# Patient Record
Sex: Female | Born: 1940
Health system: Southern US, Community
[De-identification: ages and names within clinical notes are randomized; demographics above are authoritative.]

## PROBLEM LIST (undated history)

## (undated) DIAGNOSIS — D649 Anemia, unspecified: Secondary | ICD-10-CM

## (undated) DIAGNOSIS — I10 Essential (primary) hypertension: Secondary | ICD-10-CM

## (undated) DIAGNOSIS — R011 Cardiac murmur, unspecified: Secondary | ICD-10-CM

## (undated) DIAGNOSIS — R609 Edema, unspecified: Secondary | ICD-10-CM

## (undated) DIAGNOSIS — N6009 Solitary cyst of unspecified breast: Secondary | ICD-10-CM

## (undated) DIAGNOSIS — M1990 Unspecified osteoarthritis, unspecified site: Secondary | ICD-10-CM

## (undated) DIAGNOSIS — C801 Malignant (primary) neoplasm, unspecified: Secondary | ICD-10-CM

## (undated) HISTORY — PX: JOINT REPLACEMENT: SHX530

## (undated) HISTORY — PX: ABDOMINAL HYSTERECTOMY: SHX81

## (undated) HISTORY — PX: EYE SURGERY: SHX253

## (undated) HISTORY — DX: Essential (primary) hypertension: I10

## (undated) HISTORY — PX: BREAST SURGERY: SHX581

## (undated) HISTORY — PX: COLONOSCOPY: SHX174

## (undated) HISTORY — PX: ANAL FISSURE REPAIR: SHX2312

## (undated) HISTORY — PX: CHOLECYSTECTOMY: SHX55

## (undated) HISTORY — PX: BREAST EXCISIONAL BIOPSY: SUR124

---

## 2001-07-13 HISTORY — PX: OTHER SURGICAL HISTORY: SHX169

## 2004-04-22 ENCOUNTER — Ambulatory Visit: Payer: Self-pay | Admitting: General Practice

## 2005-01-20 ENCOUNTER — Ambulatory Visit: Payer: Self-pay | Admitting: Gastroenterology

## 2005-05-23 ENCOUNTER — Ambulatory Visit: Payer: Self-pay | Admitting: Internal Medicine

## 2005-12-22 ENCOUNTER — Ambulatory Visit: Payer: Self-pay | Admitting: Internal Medicine

## 2006-06-06 ENCOUNTER — Ambulatory Visit: Payer: Self-pay | Admitting: Internal Medicine

## 2007-06-19 ENCOUNTER — Ambulatory Visit: Payer: Self-pay | Admitting: Internal Medicine

## 2008-07-17 ENCOUNTER — Ambulatory Visit: Payer: Self-pay | Admitting: Unknown Physician Specialty

## 2008-07-24 ENCOUNTER — Ambulatory Visit: Payer: Self-pay | Admitting: Internal Medicine

## 2009-07-26 ENCOUNTER — Ambulatory Visit: Payer: Self-pay | Admitting: Internal Medicine

## 2010-06-21 ENCOUNTER — Ambulatory Visit: Payer: Self-pay | Admitting: Unknown Physician Specialty

## 2010-06-23 ENCOUNTER — Inpatient Hospital Stay: Payer: Self-pay | Admitting: Unknown Physician Specialty

## 2010-09-06 ENCOUNTER — Ambulatory Visit: Payer: Self-pay | Admitting: Internal Medicine

## 2011-09-26 ENCOUNTER — Ambulatory Visit: Payer: Self-pay | Admitting: Internal Medicine

## 2012-09-26 ENCOUNTER — Ambulatory Visit: Payer: Self-pay | Admitting: Internal Medicine

## 2013-09-01 ENCOUNTER — Ambulatory Visit: Payer: Self-pay | Admitting: Gastroenterology

## 2013-09-30 ENCOUNTER — Ambulatory Visit: Payer: Self-pay | Admitting: Internal Medicine

## 2014-10-02 ENCOUNTER — Ambulatory Visit
Admit: 2014-10-02 | Disposition: A | Discharge status: Home / Self Care | Payer: Self-pay | Attending: Internal Medicine | Admitting: Internal Medicine

## 2014-10-09 ENCOUNTER — Ambulatory Visit
Admit: 2014-10-09 | Disposition: A | Discharge status: Home / Self Care | Payer: Self-pay | Attending: Internal Medicine | Admitting: Internal Medicine

## 2015-06-17 DIAGNOSIS — I10 Essential (primary) hypertension: Secondary | ICD-10-CM | POA: Diagnosis not present

## 2015-06-17 DIAGNOSIS — Z0000 Encounter for general adult medical examination without abnormal findings: Secondary | ICD-10-CM | POA: Diagnosis not present

## 2015-06-17 DIAGNOSIS — M5414 Radiculopathy, thoracic region: Secondary | ICD-10-CM | POA: Diagnosis not present

## 2015-06-17 DIAGNOSIS — M940 Chondrocostal junction syndrome [Tietze]: Secondary | ICD-10-CM | POA: Diagnosis not present

## 2015-07-08 DIAGNOSIS — Z0000 Encounter for general adult medical examination without abnormal findings: Secondary | ICD-10-CM | POA: Diagnosis not present

## 2015-07-08 DIAGNOSIS — Z23 Encounter for immunization: Secondary | ICD-10-CM | POA: Diagnosis not present

## 2015-07-20 ENCOUNTER — Other Ambulatory Visit: Payer: Self-pay | Admitting: Internal Medicine

## 2015-07-20 DIAGNOSIS — Z1231 Encounter for screening mammogram for malignant neoplasm of breast: Secondary | ICD-10-CM

## 2015-08-30 DIAGNOSIS — Z85828 Personal history of other malignant neoplasm of skin: Secondary | ICD-10-CM | POA: Diagnosis not present

## 2015-08-30 DIAGNOSIS — L570 Actinic keratosis: Secondary | ICD-10-CM | POA: Diagnosis not present

## 2015-08-30 DIAGNOSIS — D485 Neoplasm of uncertain behavior of skin: Secondary | ICD-10-CM | POA: Diagnosis not present

## 2015-08-30 DIAGNOSIS — Z872 Personal history of diseases of the skin and subcutaneous tissue: Secondary | ICD-10-CM | POA: Diagnosis not present

## 2015-08-30 DIAGNOSIS — Z08 Encounter for follow-up examination after completed treatment for malignant neoplasm: Secondary | ICD-10-CM | POA: Diagnosis not present

## 2015-08-30 DIAGNOSIS — Z1283 Encounter for screening for malignant neoplasm of skin: Secondary | ICD-10-CM | POA: Diagnosis not present

## 2015-08-30 DIAGNOSIS — L821 Other seborrheic keratosis: Secondary | ICD-10-CM | POA: Diagnosis not present

## 2015-10-04 ENCOUNTER — Other Ambulatory Visit: Payer: Self-pay | Admitting: Internal Medicine

## 2015-10-04 ENCOUNTER — Ambulatory Visit
Admission: RE | Admit: 2015-10-04 | Discharge: 2015-10-04 | Disposition: A | Discharge status: Home / Self Care | Payer: PPO | Source: Ambulatory Visit | Attending: Internal Medicine | Admitting: Internal Medicine

## 2015-10-04 DIAGNOSIS — Z1231 Encounter for screening mammogram for malignant neoplasm of breast: Secondary | ICD-10-CM

## 2015-10-04 HISTORY — DX: Malignant (primary) neoplasm, unspecified: C80.1

## 2015-12-07 DIAGNOSIS — Z791 Long term (current) use of non-steroidal anti-inflammatories (NSAID): Secondary | ICD-10-CM | POA: Diagnosis not present

## 2015-12-07 DIAGNOSIS — I8392 Asymptomatic varicose veins of left lower extremity: Secondary | ICD-10-CM | POA: Diagnosis not present

## 2015-12-07 DIAGNOSIS — Z85828 Personal history of other malignant neoplasm of skin: Secondary | ICD-10-CM | POA: Diagnosis not present

## 2015-12-07 DIAGNOSIS — Z79899 Other long term (current) drug therapy: Secondary | ICD-10-CM | POA: Insufficient documentation

## 2015-12-07 DIAGNOSIS — Z7982 Long term (current) use of aspirin: Secondary | ICD-10-CM | POA: Diagnosis not present

## 2015-12-07 DIAGNOSIS — I83892 Varicose veins of left lower extremities with other complications: Secondary | ICD-10-CM | POA: Diagnosis not present

## 2015-12-08 ENCOUNTER — Emergency Department
Admission: EM | Admit: 2015-12-08 | Discharge: 2015-12-08 | Disposition: A | Discharge status: Home / Self Care | Payer: PPO | Attending: Emergency Medicine | Admitting: Emergency Medicine

## 2015-12-08 DIAGNOSIS — I83892 Varicose veins of left lower extremities with other complications: Secondary | ICD-10-CM

## 2015-12-08 NOTE — ED Provider Notes (Signed)
[facility] Emergency Department Provider Note   ____________________________________________  Time seen: Approximately 3:15 AM  I have reviewed the triage vital signs and the nursing notes.   HISTORY  Chief Complaint Wound Check    HPI Brittany Lara is a 75 y.o. female who presents to the ED from home with a chief complaint of bleeding. Patient had a varicose vein bleeding from her left lower leg 10 days ago. It was treated by EMS with direct pressure with good success. Tonight patient was taking a shower and scratched the scab off with resultant bleeding. Dressing was placed at triage with bleeding controlled. Denies use of anticoagulants. Denies recent travel or trauma/injury. Denies chest pain, shortness of breath, lightheadedness, dizziness, abdominal pain, nausea, vomiting, diarrhea.Nothing makes her symptoms better or worse.   Past Medical History  Diagnosis Date  . Cancer ([place])     skin    There are no active problems to display for this patient.   Past Surgical History  Procedure Laterality Date  . Breast biopsy Bilateral yrs ago    benign    Current Outpatient Rx  Name  Route  Sig  Dispense  Refill  . aspirin EC 81 MG tablet   Oral   Take 81 mg by mouth daily.         . cetirizine (ZYRTEC) 10 MG tablet   Oral   Take 1 tablet by mouth daily as needed.         . furosemide (LASIX) 20 MG tablet   Oral   Take 20 mg by mouth daily.         Marland Kitchen KLOR-CON M10 10 MEQ tablet   Oral   Take 1 tablet by mouth daily.           Dispense as written.   Marland Kitchen lisinopril (PRINIVIL,ZESTRIL) 10 MG tablet   Oral   Take 10 mg by mouth daily.         . naproxen sodium (ANAPROX) 220 MG tablet   Oral   Take 220 mg by mouth 2 (two) times daily with a meal.           Allergies Review of patient's allergies indicates no known allergies.  No family history on file.  Social History Social History  Substance Use Topics  .  Smoking status: Not on file  . Smokeless tobacco: Not on file  . Alcohol Use: Not on file    Review of Systems  Constitutional: No fever/chills. Eyes: No visual changes. ENT: No sore throat. Cardiovascular: Denies chest pain. Respiratory: Denies shortness of breath. Gastrointestinal: No abdominal pain.  No nausea, no vomiting.  No diarrhea.  No constipation. Genitourinary: Negative for dysuria. Musculoskeletal: Positive for left lower leg bleeding. Negative for back pain. Skin: Negative for rash. Neurological: Negative for headaches, focal weakness or numbness.  10-point ROS otherwise negative.  ____________________________________________   PHYSICAL EXAM:  VITAL SIGNS: ED Triage Vitals  Enc Vitals Group     BP 12/08/15 0003 180/73 mmHg     Pulse Rate 12/08/15 0003 54     Resp 12/08/15 0003 18     Temp 12/08/15 0003 97.8 F (36.6 C)     Temp Source 12/08/15 0003 Oral     SpO2 12/08/15 0003 99 %     Weight 12/08/15 0003 225 lb (102.059 kg)     Height 12/08/15 0003 5\' 5"  (1.651 m)     Head Cir --      Peak Flow --  Pain Score --      Pain Loc --      Pain Edu? --      Excl. in [place]? --     Constitutional: Alert and oriented. Well appearing and in no acute distress. Eyes: Conjunctivae are normal. PERRL. EOMI. Head: Atraumatic. Nose: No congestion/rhinnorhea. Mouth/Throat: Mucous membranes are moist.  Oropharynx non-erythematous. Neck: No stridor.   Cardiovascular: Normal rate, regular rhythm. Grossly normal heart sounds.  Good peripheral circulation. Respiratory: Normal respiratory effort.  No retractions. Lungs CTAB. Gastrointestinal: Soft and nontender. No distention. No abdominal bruits. No CVA tenderness. Musculoskeletal:  LLE: Nonbleeding varicose vein with central scab to anterior lower tibia. Palpable distal pulses. 1+ nonpitting BLE edema. Neurologic:  Normal speech and language. No gross focal neurologic deficits are appreciated. No gait  instability. Skin:  Skin is warm, dry and intact. No rash noted.  Psychiatric: Mood and affect are normal. Speech and behavior are normal.  ____________________________________________   LABS (all labs ordered are listed, but only abnormal results are displayed)  Labs Reviewed - No data to display ____________________________________________  EKG  None ____________________________________________  RADIOLOGY  None ____________________________________________   PROCEDURES  Procedure(s) performed: None  Critical Care performed: No  ____________________________________________   INITIAL IMPRESSION / ASSESSMENT AND PLAN / ED COURSE  Pertinent labs & imaging results that were available during my care of the patient were reviewed by me and considered in my medical decision making (see chart for details).  75 year old female who presents with bleeding varicose vein. As she was getting undressed in the treatment room, she knocked the dressing off and the area began to bleed again. Nurse applied direct pressure with gauze with immediate control of bleeding. Pain is currently not bleeding. Will apply Surgicel and compressive dressing with ice and sandbag; will reassess in 20 minutes.  ----------------------------------------- 4:29 AM on 12/08/2015 -----------------------------------------  Scab got peeled off when removing compressive dressing. Slow venous ooze. 1% lidocaine with epinephrine injected and compression reapplied with Surgicel and ABD pad. Will recheck.  ----------------------------------------- 5:25 AM on 12/08/2015 -----------------------------------------  Rechecked area. There has been no bleeding. No blood on Surgicel. Will reapply Surgicel and APD pad and wrapped in Coban. I have encouraged the patient to leave compressive dressing on until later this evening when she showers and she is not to rub or pat the area. I have also recommended that she use her  walker over the next few days to avoid putting her full weight on her left leg. Will provide contact information for vascular surgeon for follow-up. Strict return precautions given. Patient and daughter verbalize understanding and agree with plan of care. ____________________________________________   FINAL CLINICAL IMPRESSION(S) / ED DIAGNOSES  Final diagnoses:  Bleeding from varicose vein, left      NEW MEDICATIONS STARTED DURING THIS VISIT:  New Prescriptions   No medications on file     Note:  This document was prepared using Dragon voice recognition software and may include unintentional dictation errors.    Paulette Blanch, MD 12/08/15 [phone]

## 2015-12-08 NOTE — ED Notes (Signed)
When Dr. Beather Arbour took off the gauze wrap, the pt had stopped bleeding. Dr. Beather Arbour applied surgicel, gauze, wrapped in gauze, coban, ice and sandbag and will re-check later to see if bleeding remained stopped.

## 2015-12-08 NOTE — ED Notes (Signed)
Pt states she is normally bradycardic.

## 2015-12-08 NOTE — ED Notes (Signed)
Dr. Beather Arbour checked pt's leg, took off bandages. Pt started bleeding. Dr. Beather Arbour placed surgicel, used lidocaine with epi, placed an abdominal pad, ice pack, sand bag, elevated extremity to try to control bleeding.

## 2015-12-08 NOTE — Discharge Instructions (Signed)
1. Keep compressive dressing in place until this evening when you shower. Do not rub or pack the area. 2. Use your walker for the next few days so you do not put your full weight on your left leg. 3. Return to the ER for recurrent or worsening symptoms, persistent vomiting, difficult breathing, feeling faint or other concerns.  Bleeding Varicose Veins Varicose veins are veins that have become enlarged and twisted. Valves in the veins help return blood from the leg to the heart. If these valves are damaged, blood flows backward and backs up into the veins in the leg near the skin. This causes the veins to become larger because of increased pressure within them. Sometimes these veins bleed. CAUSES  Factors that can lead to bleeding varicose veins include:  Thinning of the skin that covers the veins. This skin is stretched as the veins enlarge.  Weak and thinning walls of the varicose veins. These thin walls are part of the reason why blood is not flowing normally to the heart.  Having high pressure in the veins. This high pressure occurs because the blood is not flowing freely back up to the heart.  Injury. Even a small injury to a varicose vein can cause bleeding.  Open wounds. A sore may develop near a varicose vein and not heal. This makes bleeding more likely.  Taking medicine that thins the blood. These medicines may include aspirin, anti-inflammatory medicine, and other blood thinners. SIGNS AND SYMPTOMS  If bleeding is on the outside surface of the skin, blood can be seen. Sometimes, the bleeding stays under the skin. If this happens, the blue or purple area will spread beyond the vein. This discoloration may be visible. DIAGNOSIS  To decide if you have a bleeding varicose vein, your health care provider may:  Ask about your symptoms. This will include when you first saw bleeding.  Ask about how long you have had varicose veins and if they cause you problems.  Ask about your overall  health.  Ask about possible causes, such as recent cuts or if the area near the varicose veins was bumped or injured.  Examine the skin or leg that concerns you. Your health care provider will probably feel the veins.  Order imaging tests. These create detailed pictures of the veins. TREATMENT  The first goal of treating bleeding varicose veins is to stop the bleeding. Then, the aim is to keep any bleeding from happening again. Treatment will depend on the cause of the bleeding and how bad it is. Ask your health care provider about what would be best for you. Options include:  Raising (elevating) your leg. Lie down with your leg propped up on a pillow or cushion. Your foot should be above the level of your heart.  Applying pressure to the spot that is bleeding. The bleeding should stop in a short time.  Wearing elastic stockings that "compress" your legs (compression stockings). An elastic bandage may do the same thing.  Applying an antibiotic cream on sores that are not healing.  Closing off or surgically removing the bleeding varicose veins with one of the following:  Sclerotherapy. A solution is injected into the vein to close it off.  Laser treatment. A laser is used to heat the vein to close it off.  Radiofrequency vein ablation. An electrical current produced by radio waves is used to close off the vein.  Phlebectomy. The vein is surgically removed through small incisions made over the varicose vein.  Vein  ligation and stripping. The vein is surgically removed through incisions made over the varicose vein after the vein has been tied (ligated). HOME CARE INSTRUCTIONS   Apply any creams that your health care provider prescribed. Follow the directions carefully.  Wear compression stockings or any wraps as directed by your health care provider. Make sure you know:  If you should wear them every day.  How long you should wear them.  If veins were removed or closed, a bandage  (dressing) will probably cover the area. Make sure you know:  How often the dressing should be changed.  Whether the area can get wet.  When you can leave the skin uncovered.  Check your skin every day. Look for new sores and signs of bleeding.  To prevent future bleeding:  Use extra care in situations where you could cut your legs, such as when shaving or gardening.  Try to keep your legs elevated as much as possible. Lie down when you can. SEEK MEDICAL CARE IF:   Your veins continue to bleed.  You develop new sores near your varicose veins.  You have a sore that does not heal or gets bigger.  You have increased pain in your leg.  The area around a varicose vein becomes warm, red, or tender to the touch.  You notice a yellowish fluid that smells bad coming from a spot where there was bleeding.  You have a fever. SEEK IMMEDIATE MEDICAL CARE IF:   You have chest pain or difficulty breathing.  You have severe leg pain.   This information is not intended to replace advice given to you by your health care provider. Make sure you discuss any questions you have with your health care provider.   Document Released: 10/15/2008 Document Revised: 06/19/2014 Document Reviewed: 09/30/2013 Elsevier Interactive Patient Education [employer].

## 2015-12-08 NOTE — ED Notes (Signed)
Pt was taking shower and noted bleeding from left lower leg, hx of the same 10 days ago and was a varicose vein.

## 2015-12-15 DIAGNOSIS — M7989 Other specified soft tissue disorders: Secondary | ICD-10-CM | POA: Diagnosis not present

## 2015-12-15 DIAGNOSIS — I8312 Varicose veins of left lower extremity with inflammation: Secondary | ICD-10-CM | POA: Diagnosis not present

## 2015-12-15 DIAGNOSIS — I8311 Varicose veins of right lower extremity with inflammation: Secondary | ICD-10-CM | POA: Diagnosis not present

## 2015-12-24 DIAGNOSIS — I8311 Varicose veins of right lower extremity with inflammation: Secondary | ICD-10-CM | POA: Diagnosis not present

## 2015-12-24 DIAGNOSIS — I8312 Varicose veins of left lower extremity with inflammation: Secondary | ICD-10-CM | POA: Diagnosis not present

## 2015-12-24 DIAGNOSIS — M7989 Other specified soft tissue disorders: Secondary | ICD-10-CM | POA: Diagnosis not present

## 2015-12-24 DIAGNOSIS — I868 Varicose veins of other specified sites: Secondary | ICD-10-CM | POA: Diagnosis not present

## 2015-12-28 DIAGNOSIS — M7989 Other specified soft tissue disorders: Secondary | ICD-10-CM | POA: Diagnosis not present

## 2015-12-28 DIAGNOSIS — I868 Varicose veins of other specified sites: Secondary | ICD-10-CM | POA: Diagnosis not present

## 2015-12-28 DIAGNOSIS — M79609 Pain in unspecified limb: Secondary | ICD-10-CM | POA: Diagnosis not present

## 2015-12-28 DIAGNOSIS — I8311 Varicose veins of right lower extremity with inflammation: Secondary | ICD-10-CM | POA: Diagnosis not present

## 2015-12-28 DIAGNOSIS — I8312 Varicose veins of left lower extremity with inflammation: Secondary | ICD-10-CM | POA: Diagnosis not present

## 2015-12-28 DIAGNOSIS — I872 Venous insufficiency (chronic) (peripheral): Secondary | ICD-10-CM | POA: Diagnosis not present

## 2016-01-17 DIAGNOSIS — M5010 Cervical disc disorder with radiculopathy, unspecified cervical region: Secondary | ICD-10-CM | POA: Diagnosis not present

## 2016-02-01 DIAGNOSIS — I83813 Varicose veins of bilateral lower extremities with pain: Secondary | ICD-10-CM | POA: Diagnosis not present

## 2016-02-01 DIAGNOSIS — I868 Varicose veins of other specified sites: Secondary | ICD-10-CM | POA: Diagnosis not present

## 2016-02-01 DIAGNOSIS — I8312 Varicose veins of left lower extremity with inflammation: Secondary | ICD-10-CM | POA: Diagnosis not present

## 2016-02-01 DIAGNOSIS — R58 Hemorrhage, not elsewhere classified: Secondary | ICD-10-CM | POA: Diagnosis not present

## 2016-02-01 DIAGNOSIS — M7989 Other specified soft tissue disorders: Secondary | ICD-10-CM | POA: Diagnosis not present

## 2016-02-01 DIAGNOSIS — M79609 Pain in unspecified limb: Secondary | ICD-10-CM | POA: Diagnosis not present

## 2016-02-01 DIAGNOSIS — I8311 Varicose veins of right lower extremity with inflammation: Secondary | ICD-10-CM | POA: Diagnosis not present

## 2016-02-01 DIAGNOSIS — I872 Venous insufficiency (chronic) (peripheral): Secondary | ICD-10-CM | POA: Diagnosis not present

## 2016-02-04 DIAGNOSIS — M7989 Other specified soft tissue disorders: Secondary | ICD-10-CM | POA: Diagnosis not present

## 2016-02-04 DIAGNOSIS — I8312 Varicose veins of left lower extremity with inflammation: Secondary | ICD-10-CM | POA: Diagnosis not present

## 2016-02-04 DIAGNOSIS — I872 Venous insufficiency (chronic) (peripheral): Secondary | ICD-10-CM | POA: Diagnosis not present

## 2016-02-04 DIAGNOSIS — I83813 Varicose veins of bilateral lower extremities with pain: Secondary | ICD-10-CM | POA: Diagnosis not present

## 2016-02-04 DIAGNOSIS — I868 Varicose veins of other specified sites: Secondary | ICD-10-CM | POA: Diagnosis not present

## 2016-02-04 DIAGNOSIS — I8311 Varicose veins of right lower extremity with inflammation: Secondary | ICD-10-CM | POA: Diagnosis not present

## 2016-02-04 DIAGNOSIS — M79609 Pain in unspecified limb: Secondary | ICD-10-CM | POA: Diagnosis not present

## 2016-02-04 DIAGNOSIS — R58 Hemorrhage, not elsewhere classified: Secondary | ICD-10-CM | POA: Diagnosis not present

## 2016-02-22 DIAGNOSIS — M7989 Other specified soft tissue disorders: Secondary | ICD-10-CM | POA: Diagnosis not present

## 2016-02-22 DIAGNOSIS — M79609 Pain in unspecified limb: Secondary | ICD-10-CM | POA: Diagnosis not present

## 2016-02-22 DIAGNOSIS — I8312 Varicose veins of left lower extremity with inflammation: Secondary | ICD-10-CM | POA: Diagnosis not present

## 2016-02-22 DIAGNOSIS — I8311 Varicose veins of right lower extremity with inflammation: Secondary | ICD-10-CM | POA: Diagnosis not present

## 2016-02-22 DIAGNOSIS — R58 Hemorrhage, not elsewhere classified: Secondary | ICD-10-CM | POA: Diagnosis not present

## 2016-02-22 DIAGNOSIS — I83813 Varicose veins of bilateral lower extremities with pain: Secondary | ICD-10-CM | POA: Diagnosis not present

## 2016-02-22 DIAGNOSIS — I872 Venous insufficiency (chronic) (peripheral): Secondary | ICD-10-CM | POA: Diagnosis not present

## 2016-02-22 DIAGNOSIS — I868 Varicose veins of other specified sites: Secondary | ICD-10-CM | POA: Diagnosis not present

## 2016-02-28 DIAGNOSIS — Z08 Encounter for follow-up examination after completed treatment for malignant neoplasm: Secondary | ICD-10-CM | POA: Diagnosis not present

## 2016-02-28 DIAGNOSIS — Z872 Personal history of diseases of the skin and subcutaneous tissue: Secondary | ICD-10-CM | POA: Diagnosis not present

## 2016-02-28 DIAGNOSIS — Z1283 Encounter for screening for malignant neoplasm of skin: Secondary | ICD-10-CM | POA: Diagnosis not present

## 2016-02-28 DIAGNOSIS — Z85828 Personal history of other malignant neoplasm of skin: Secondary | ICD-10-CM | POA: Diagnosis not present

## 2016-02-28 DIAGNOSIS — D485 Neoplasm of uncertain behavior of skin: Secondary | ICD-10-CM | POA: Diagnosis not present

## 2016-02-28 DIAGNOSIS — L821 Other seborrheic keratosis: Secondary | ICD-10-CM | POA: Diagnosis not present

## 2016-03-06 DIAGNOSIS — I83813 Varicose veins of bilateral lower extremities with pain: Secondary | ICD-10-CM | POA: Diagnosis not present

## 2016-03-06 DIAGNOSIS — I868 Varicose veins of other specified sites: Secondary | ICD-10-CM | POA: Diagnosis not present

## 2016-03-06 DIAGNOSIS — I8312 Varicose veins of left lower extremity with inflammation: Secondary | ICD-10-CM | POA: Diagnosis not present

## 2016-03-06 DIAGNOSIS — I872 Venous insufficiency (chronic) (peripheral): Secondary | ICD-10-CM | POA: Diagnosis not present

## 2016-03-06 DIAGNOSIS — M7989 Other specified soft tissue disorders: Secondary | ICD-10-CM | POA: Diagnosis not present

## 2016-03-06 DIAGNOSIS — R58 Hemorrhage, not elsewhere classified: Secondary | ICD-10-CM | POA: Diagnosis not present

## 2016-03-06 DIAGNOSIS — M79609 Pain in unspecified limb: Secondary | ICD-10-CM | POA: Diagnosis not present

## 2016-03-06 DIAGNOSIS — I8311 Varicose veins of right lower extremity with inflammation: Secondary | ICD-10-CM | POA: Diagnosis not present

## 2016-03-27 ENCOUNTER — Encounter (INDEPENDENT_AMBULATORY_CARE_PROVIDER_SITE_OTHER): Payer: Self-pay | Admitting: Vascular Surgery

## 2016-03-27 ENCOUNTER — Ambulatory Visit (INDEPENDENT_AMBULATORY_CARE_PROVIDER_SITE_OTHER): Payer: PPO | Admitting: Vascular Surgery

## 2016-03-27 VITALS — BP 138/83 | HR 79 | Resp 16 | Ht 65.0 in | Wt 230.0 lb

## 2016-03-27 DIAGNOSIS — I8312 Varicose veins of left lower extremity with inflammation: Secondary | ICD-10-CM | POA: Diagnosis not present

## 2016-03-27 DIAGNOSIS — I8311 Varicose veins of right lower extremity with inflammation: Secondary | ICD-10-CM

## 2016-03-27 NOTE — Progress Notes (Signed)
Left Leg Sclerotherapy  Indication: Patient presents with symptomatic varicose veins with hemorrhage of the left lower extremity.  Procedure: Sclerotherapy using hypertonic saline mixed with 1% Lidocaine was performed on the left lower extremity. Compression wraps were placed. The patient tolerated the procedure well.  Plan: Follow up as needed.

## 2016-04-21 DIAGNOSIS — M542 Cervicalgia: Secondary | ICD-10-CM | POA: Diagnosis not present

## 2016-07-04 DIAGNOSIS — I10 Essential (primary) hypertension: Secondary | ICD-10-CM | POA: Diagnosis not present

## 2016-07-04 DIAGNOSIS — Z0000 Encounter for general adult medical examination without abnormal findings: Secondary | ICD-10-CM | POA: Diagnosis not present

## 2016-07-11 DIAGNOSIS — Z0000 Encounter for general adult medical examination without abnormal findings: Secondary | ICD-10-CM | POA: Diagnosis not present

## 2016-07-11 DIAGNOSIS — M47812 Spondylosis without myelopathy or radiculopathy, cervical region: Secondary | ICD-10-CM | POA: Diagnosis not present

## 2016-07-11 DIAGNOSIS — M542 Cervicalgia: Secondary | ICD-10-CM | POA: Diagnosis not present

## 2016-07-17 ENCOUNTER — Telehealth (INDEPENDENT_AMBULATORY_CARE_PROVIDER_SITE_OTHER): Payer: Self-pay | Admitting: Vascular Surgery

## 2016-07-17 NOTE — Telephone Encounter (Signed)
Patient called and said that she still has a scab from her sclerotherapy injections. She stated that it sometimes bleeds. Her last treatment was October. She wants to know if and when it will clear up or does she need an appointment. [phone]

## 2016-08-01 ENCOUNTER — Encounter (INDEPENDENT_AMBULATORY_CARE_PROVIDER_SITE_OTHER): Payer: Self-pay | Admitting: Vascular Surgery

## 2016-08-01 ENCOUNTER — Ambulatory Visit (INDEPENDENT_AMBULATORY_CARE_PROVIDER_SITE_OTHER): Payer: PPO | Admitting: Vascular Surgery

## 2016-08-01 VITALS — BP 155/84 | HR 84 | Resp 16 | Ht 65.0 in | Wt 240.0 lb

## 2016-08-01 DIAGNOSIS — I83812 Varicose veins of left lower extremities with pain: Secondary | ICD-10-CM

## 2016-08-01 DIAGNOSIS — I10 Essential (primary) hypertension: Secondary | ICD-10-CM | POA: Diagnosis not present

## 2016-08-01 NOTE — Assessment & Plan Note (Signed)
She has developed a scab on her left calf. She has had significant sclerotherapy and it is not entirely clear if this is a chronic ulceration secondary to sclerotherapy site or just a venous insufficiency issue. She does not have fever or chills. I unroofed the scab today to the underlying granulation tissue which is pretty good. I would recommend antibacterial ointment. I would cover this area with a Band-Aid or gauze. I will see her back in about 3 weeks for a wound check.

## 2016-08-01 NOTE — Assessment & Plan Note (Signed)
blood pressure control important in reducing the progression of atherosclerotic disease. On appropriate oral medications.  

## 2016-08-01 NOTE — Progress Notes (Signed)
MRN : [id number]  Brittany Lara is a 76 y.o. (Apr 03, 1941) female who presents with chief complaint of  Chief Complaint  Patient presents with  . Re-evaluation    2 week follow up, check out surgery site  .  History of Present Illness: Patient returns today in follow up of Venous insufficiency. She had sclerotherapy performed several months ago. She has had a nonhealing scab on the left anterior shin area since that time. She returns today with mild drainage from the scab. It is not overtly painful. There is no erythema.  Current Outpatient Prescriptions  Medication Sig Dispense Refill  . aspirin EC 81 MG tablet Take 81 mg by mouth daily.    . cetirizine (ZYRTEC) 10 MG tablet Take 1 tablet by mouth daily as needed.    . Cholecalciferol (VITAMIN D3) 1000 units CAPS Take by mouth.    . furosemide (LASIX) 20 MG tablet Take 20 mg by mouth daily.    Marland Kitchen KLOR-CON M10 10 MEQ tablet Take 1 tablet by mouth daily.    Marland Kitchen lisinopril (PRINIVIL,ZESTRIL) 10 MG tablet Take 10 mg by mouth daily.    . meloxicam (MOBIC) 7.5 MG tablet Take by mouth.    . naproxen sodium (ANAPROX) 220 MG tablet Take 220 mg by mouth 2 (two) times daily with a meal.    . PREVNAR 13 SUSP injection      No current facility-administered medications for this visit.     Past Medical History:  Diagnosis Date  . Cancer [facility])    skin    Past Surgical History:  Procedure Laterality Date  . BREAST BIOPSY Bilateral yrs ago   benign    Social History Social History  Substance Use Topics  . Smoking status: Never Smoker  . Smokeless tobacco: Never Used  . Alcohol use No     Family History No bleeding or clotting disorders  No Known Allergies   REVIEW OF SYSTEMS (Negative unless checked)  Constitutional: [] Weight loss  [] Fever  [] Chills Cardiac: [] Chest pain   [] Chest pressure   [] Palpitations   [] Shortness of breath when laying flat   [] Shortness of breath at rest   [] Shortness of breath with  exertion. Vascular:  [] Pain in legs with walking   [] Pain in legs at rest   [] Pain in legs when laying flat   [] Claudication   [] Pain in feet when walking  [] Pain in feet at rest  [] Pain in feet when laying flat   [] History of DVT   [] Phlebitis   [x] Swelling in legs   [x] Varicose veins   [] Non-healing ulcers Pulmonary:   [] Uses home oxygen   [] Productive cough   [] Hemoptysis   [] Wheeze  [] COPD   [] Asthma Neurologic:  [] Dizziness  [] Blackouts   [] Seizures   [] History of stroke   [] History of TIA  [] Aphasia   [] Temporary blindness   [] Dysphagia   [] Weakness or numbness in arms   [] Weakness or numbness in legs Musculoskeletal:  [] Arthritis   [] Joint swelling   [] Joint pain   [] Low back pain Hematologic:  [] Easy bruising  [] Easy bleeding   [] Hypercoagulable state   [] Anemic   Gastrointestinal:  [] Blood in stool   [] Vomiting blood  [] Gastroesophageal reflux/heartburn   [] Abdominal pain Genitourinary:  [] Chronic kidney disease   [] Difficult urination  [] Frequent urination  [] Burning with urination   [] Hematuria Skin:  [] Rashes   [x] Ulcers   [x] Wounds Psychological:  [] History of anxiety   []  History of major depression.  Physical Examination  BP Marland Kitchen)  155/84 (BP Location: Right Arm)   Pulse 84   Resp 16   Ht 5\' 5"  (1.651 m)   Wt 240 lb (108.9 kg)   BMI 39.94 kg/m  Gen:  WD/WN, NAD Head: [place]/AT, No temporalis wasting. Ear/Nose/Throat: Hearing grossly intact, nares w/o erythema or drainage, trachea midline Eyes: Conjunctiva clear. Sclera non-icteric Neck: Supple.  No JVD.  Pulmonary:  Good air movement, no use of accessory muscles.  Cardiac: RRR, normal S1, S2 Vascular:  Vessel Right Left  Radial Palpable Palpable  Ulnar Palpable Palpable  Brachial Palpable Palpable  Carotid Palpable, without bruit Palpable, without bruit  Aorta Not palpable N/A  Femoral Palpable Palpable  Popliteal Palpable Palpable  PT Palpable Palpable  DP Palpable Palpable   Gastrointestinal: soft,  non-tender/non-distended. No guarding/reflex.  Musculoskeletal: M/S 5/5 throughout.  No deformity or atrophy. Diffuse varicosities bilaterally. Neurologic: Sensation grossly intact in extremities.  Symmetrical.  Speech is fluent.  Psychiatric: Judgment intact, Mood & affect appropriate for pt's clinical situation. Dermatologic: About a dime-sized scab in the mid anterior left shin/calf area. This was unroofed today to find some fibrinous exudate and good granulation tissue. Lymph : No Cervical, Axillary, or Inguinal lymphadenopathy.      Labs No results found for this or any previous visit (from the past 2160 hour(s)).  Radiology No results found.    Assessment/Plan  Essential hypertension, benign blood pressure control important in reducing the progression of atherosclerotic disease. On appropriate oral medications.   Varicose veins of leg with pain, left She has developed a scab on her left calf. She has had significant sclerotherapy and it is not entirely clear if this is a chronic ulceration secondary to sclerotherapy site or just a venous insufficiency issue. She does not have fever or chills. I unroofed the scab today to the underlying granulation tissue which is pretty good. I would recommend antibacterial ointment. I would cover this area with a Band-Aid or gauze. I will see her back in about 3 weeks for a wound check.    Leotis Pain, MD  08/01/2016 2:26 PM    This note was created with Dragon medical transcription system.  Any errors from dictation are purely unintentional

## 2016-08-22 ENCOUNTER — Encounter (INDEPENDENT_AMBULATORY_CARE_PROVIDER_SITE_OTHER): Payer: Self-pay | Admitting: Vascular Surgery

## 2016-08-22 ENCOUNTER — Ambulatory Visit (INDEPENDENT_AMBULATORY_CARE_PROVIDER_SITE_OTHER): Payer: PPO | Admitting: Vascular Surgery

## 2016-08-22 VITALS — BP 166/85 | HR 92 | Resp 16 | Ht 65.0 in | Wt 235.0 lb

## 2016-08-22 DIAGNOSIS — I83812 Varicose veins of left lower extremities with pain: Secondary | ICD-10-CM

## 2016-08-22 DIAGNOSIS — I10 Essential (primary) hypertension: Secondary | ICD-10-CM

## 2016-08-22 NOTE — Assessment & Plan Note (Addendum)
I unroofed this scab again today and use some silver nitrate on the granulation tissue to try to get this area to close up. If this does not close up in the next few weeks, I think we should consider some foam sclerotherapy for the surrounding varicosities as the increased venous pressure is likely contributing to poor healing. I will see her back in 3 weeks.

## 2016-08-22 NOTE — Assessment & Plan Note (Signed)
blood pressure control important in reducing the progression of atherosclerotic disease. On appropriate oral medications.  

## 2016-08-22 NOTE — Progress Notes (Signed)
MRN : [id number]  Brittany Lara is a 76 y.o. (08-Mar-1941) female who presents with chief complaint of  Chief Complaint  Patient presents with  . Re-evaluation    Left leg wound check  .  History of Present Illness: Patient returns today in follow up of her left anterior calf wound. This area has recurred and created some more heaped up granulation tissue with a scab. There is no erythema. There are prominent varicosity surrounding this.   Current Outpatient Prescriptions  Medication Sig Dispense Refill  . aspirin EC 81 MG tablet Take 81 mg by mouth daily.    . cetirizine (ZYRTEC) 10 MG tablet Take 1 tablet by mouth daily as needed.    . Cholecalciferol (VITAMIN D3) 1000 units CAPS Take by mouth.    . furosemide (LASIX) 20 MG tablet Take 20 mg by mouth daily.    Marland Kitchen KLOR-CON M10 10 MEQ tablet Take 1 tablet by mouth daily.    Marland Kitchen lisinopril (PRINIVIL,ZESTRIL) 10 MG tablet Take 10 mg by mouth daily.    . meloxicam (MOBIC) 7.5 MG tablet Take by mouth.    . naproxen sodium (ANAPROX) 220 MG tablet Take 220 mg by mouth 2 (two) times daily with a meal.    . PREVNAR 13 SUSP injection      No current facility-administered medications for this visit.     Past Medical History:  Diagnosis Date  . Cancer [facility])    skin         Past Surgical History:  Procedure Laterality Date  . BREAST BIOPSY Bilateral yrs ago   benign    Social History     Social History  Substance Use Topics  . Smoking status: Never Smoker  . Smokeless tobacco: Never Used  . Alcohol use No     Family History No bleeding or clotting disorders  No Known Allergies   REVIEW OF SYSTEMS (Negative unless checked)  Constitutional: [] Weight loss  [] Fever  [] Chills Cardiac: [] Chest pain   [] Chest pressure   [] Palpitations   [] Shortness of breath when laying flat   [] Shortness of breath at rest   [] Shortness of breath with exertion. Vascular:  [] Pain in legs with walking    [] Pain in legs at rest   [] Pain in legs when laying flat   [] Claudication   [] Pain in feet when walking  [] Pain in feet at rest  [] Pain in feet when laying flat   [] History of DVT   [] Phlebitis   [x] Swelling in legs   [x] Varicose veins   [] Non-healing ulcers Pulmonary:   [] Uses home oxygen   [] Productive cough   [] Hemoptysis   [] Wheeze  [] COPD   [] Asthma Neurologic:  [] Dizziness  [] Blackouts   [] Seizures   [] History of stroke   [] History of TIA  [] Aphasia   [] Temporary blindness   [] Dysphagia   [] Weakness or numbness in arms   [] Weakness or numbness in legs Musculoskeletal:  [] Arthritis   [] Joint swelling   [] Joint pain   [] Low back pain Hematologic:  [] Easy bruising  [] Easy bleeding   [] Hypercoagulable state   [] Anemic   Gastrointestinal:  [] Blood in stool   [] Vomiting blood  [] Gastroesophageal reflux/heartburn   [] Abdominal pain Genitourinary:  [] Chronic kidney disease   [] Difficult urination  [] Frequent urination  [] Burning with urination   [] Hematuria Skin:  [] Rashes   [x] Ulcers   [x] Wounds Psychological:  [] History of anxiety   []  History of major depression.  Physical Examination  BP (!) 155/84 (BP Location: Right Arm)  Pulse 84   Resp 16   Ht 5\' 5"  (1.651 m)   Wt 240 lb (108.9 kg)   BMI 39.94 kg/m  Gen:  WD/WN, NAD Head: [place]/AT, No temporalis wasting. Ear/Nose/Throat: Hearing grossly intact, nares w/o erythema or drainage, trachea midline Eyes: Conjunctiva clear. Sclera non-icteric Neck: Supple.  No JVD.  Pulmonary:  Good air movement, no use of accessory muscles.  Cardiac: RRR, normal S1, S2 Vascular:  Vessel Right Left  Radial Palpable Palpable  Ulnar Palpable Palpable  Brachial Palpable Palpable  Carotid Palpable, without bruit Palpable, without bruit  Aorta Not palpable N/A  Femoral Palpable Palpable  Popliteal Palpable Palpable  PT Palpable Palpable  DP Palpable Palpable   Gastrointestinal: soft, non-tender/non-distended. No guarding/reflex.  Musculoskeletal: M/S  5/5 throughout.  No deformity or atrophy. Diffuse varicosities bilaterally. Neurologic: Sensation grossly intact in extremities.  Symmetrical.  Speech is fluent.  Psychiatric: Judgment intact, Mood & affect appropriate for pt's clinical situation. Dermatologic: About a dime-sized scab in the mid anterior left shin/calf area. This was unroofed today to find some fibrinous exudate and good granulation tissue. I took some silver nitrate to the granulation tissue to try to get this area to close up. Lymph : No Cervical, Axillary, or Inguinal lymphadenopathy.      Labs No results found for this or any previous visit (from the past 2160 hour(s)).  Radiology No results found.   Assessment/Plan  Varicose veins of leg with pain, left I unroofed this scab again today and use some silver nitrate on the granulation tissue to try to get this area to close up. If this does not close up in the next few weeks, I think we should consider some foam sclerotherapy for the surrounding varicosities as the increased venous pressure is likely contributing to poor healing. I will see her back in 3 weeks.   Essential hypertension, benign blood pressure control important in reducing the progression of atherosclerotic disease. On appropriate oral medications.     Leotis Pain, MD  08/22/2016 1:47 PM    This note was created with Dragon medical transcription system.  Any errors from dictation are purely unintentional

## 2016-09-12 ENCOUNTER — Ambulatory Visit (INDEPENDENT_AMBULATORY_CARE_PROVIDER_SITE_OTHER): Payer: PPO | Admitting: Vascular Surgery

## 2016-10-03 ENCOUNTER — Encounter (INDEPENDENT_AMBULATORY_CARE_PROVIDER_SITE_OTHER): Payer: Self-pay | Admitting: Vascular Surgery

## 2016-10-03 ENCOUNTER — Ambulatory Visit (INDEPENDENT_AMBULATORY_CARE_PROVIDER_SITE_OTHER): Payer: PPO | Admitting: Vascular Surgery

## 2016-10-03 VITALS — BP 143/67 | HR 66 | Resp 16 | Ht 66.0 in | Wt 234.0 lb

## 2016-10-03 DIAGNOSIS — I83812 Varicose veins of left lower extremities with pain: Secondary | ICD-10-CM

## 2016-10-03 DIAGNOSIS — I10 Essential (primary) hypertension: Secondary | ICD-10-CM | POA: Diagnosis not present

## 2016-10-03 NOTE — Progress Notes (Signed)
MRN : [id number]  Brittany Lara is a 76 y.o. (12-07-1940) female who presents with chief complaint of  Chief Complaint  Patient presents with  . Re-evaluation    3 week no studies  .  History of Present Illness: Patient returns today in follow up of Venous insufficiency and the wound on her left leg. We've previously treated this with silver nitrate, and it looks a lot better today. It is half the size were less than it was previously. There is no heaped up granulation tissue with just a nice smooth scab present currently. She says this does bleed some with showering, but there has not been a lot of bleeding or drainage from the site. It is really not painful. She has no fever or chills or signs of infection.  Current Outpatient Prescriptions  Medication Sig Dispense Refill  . aspirin EC 81 MG tablet Take 81 mg by mouth daily.    . cetirizine (ZYRTEC) 10 MG tablet Take 1 tablet by mouth daily as needed.    . Cholecalciferol (VITAMIN D3) 1000 units CAPS Take by mouth.    . furosemide (LASIX) 20 MG tablet Take 20 mg by mouth daily.    Marland Kitchen KLOR-CON M10 10 MEQ tablet Take 1 tablet by mouth daily.    Marland Kitchen lisinopril (PRINIVIL,ZESTRIL) 10 MG tablet Take 10 mg by mouth daily.    . meloxicam (MOBIC) 7.5 MG tablet Take by mouth.    . naproxen sodium (ANAPROX) 220 MG tablet Take 220 mg by mouth 2 (two) times daily with a meal.    . PREVNAR 13 SUSP injection      No current facility-administered medications for this visit.         Past Medical History:  Diagnosis Date  . Cancer [facility])    skin         Past Surgical History:  Procedure Laterality Date  . BREAST BIOPSY Bilateral yrs ago   benign    Social History     Social History  Substance Use Topics  . Smoking status: Never Smoker  . Smokeless tobacco: Never Used  . Alcohol use No     Family History No bleeding or clotting disorders  No Known Allergies   REVIEW OF  SYSTEMS(Negative unless checked)  Constitutional: [] Weight loss[] Fever[] Chills Cardiac:[] Chest pain[] Chest pressure[] Palpitations [] Shortness of breath when laying flat [] Shortness of breath at rest [] Shortness of breath with exertion. Vascular: [] Pain in legs with walking[] Pain in legsat rest[] Pain in legs when laying flat [] Claudication [] Pain in feet when walking [] Pain in feet at rest [] Pain in feet when laying flat [] History of DVT [] Phlebitis [x] Swelling in legs [x] Varicose veins [] Non-healing ulcers Pulmonary: [] Uses home oxygen [] Productive cough[] Hemoptysis [] Wheeze [] COPD [] Asthma Neurologic: [] Dizziness [] Blackouts [] Seizures [] History of stroke [] History of TIA[] Aphasia [] Temporary blindness[] Dysphagia [] Weaknessor numbness in arms [] Weakness or numbnessin legs Musculoskeletal: [] Arthritis [] Joint swelling [] Joint pain [] Low back pain Hematologic:[] Easy bruising[] Easy bleeding [] Hypercoagulable state [] Anemic  Gastrointestinal:[] Blood in stool[] Vomiting blood[] Gastroesophageal reflux/heartburn[] Abdominal pain Genitourinary: [] Chronic kidney disease [] Difficulturination [] Frequenturination [] Burning with urination[] Hematuria Skin: [] Rashes [x] Ulcers [x] Wounds Psychological: [] History of anxiety[] History of major depression.   Physical Examination  BP (!) 143/67   Pulse 66   Resp 16   Ht 5\' 6"  (1.676 m)   Wt 234 lb (106.1 kg)   BMI 37.77 kg/m  Gen:  WD/WN, NAD Head: [place]/AT, No temporalis wasting. Ear/Nose/Throat: Hearing grossly intact, nares w/o erythema or drainage, trachea midline Eyes: Conjunctiva clear. Sclera non-icteric Neck: Supple.  No JVD.  Pulmonary:  Good air movement, no  use of accessory muscles.  Cardiac: RRR, normal S1, S2 Vascular:  Vessel Right Left  Radial Palpable Palpable  Ulnar Palpable Palpable  Brachial Palpable Palpable  Carotid  Palpable, without bruit Palpable, without bruit  Aorta Not palpable N/A  Femoral Palpable Palpable  Popliteal Palpable Palpable  PT Palpable Palpable  DP Palpable Palpable   Gastrointestinal: soft, non-tender/non-distended. No guarding/reflex.  Musculoskeletal: M/S 5/5 throughout.  No deformity or atrophy. Diffuse varicosities with stasis changes present bilaterally. 1+ lower extremity edema bilaterally. The area on the left shin/calf area is now only a small scab about half the size of a dime. Neurologic: Sensation grossly intact in extremities.  Symmetrical.  Speech is fluent.  Psychiatric: Judgment intact, Mood & affect appropriate for pt's clinical situation. Dermatologic: Scab about half the size of a dime on the left shin/calf area that has been previously treated with silver nitrate. No heaped up granulation tissue today.       Labs No results found for this or any previous visit (from the past 2160 hour(s)).  Radiology No results found.    Assessment/Plan  Essential hypertension, benign blood pressure control important in reducing the progression of atherosclerotic disease. On appropriate oral medications.  Varicose veins of leg with pain, left The area is much improved although not entirely healed. I'm a bit leery of performing any more sclerotherapy at this point given the fact that this was likely one of the underlying issues that created the scab to begin with. I have discussed with her was just leave this alone and see how he does for a few months. We may eventually have to do something further to the area to reduce the venous pressure get this to heal completely, but at current is improved without erythema or infection. She will return to clinic in 3 months for a wound check.    Leotis Pain, MD  10/03/2016 4:20 PM    This note was created with Dragon medical transcription system.  Any errors from dictation are purely unintentional

## 2016-10-03 NOTE — Assessment & Plan Note (Signed)
The area is much improved although not entirely healed. I'm a bit leery of performing any more sclerotherapy at this point given the fact that this was likely one of the underlying issues that created the scab to begin with. I have discussed with her was just leave this alone and see how he does for a few months. We may eventually have to do something further to the area to reduce the venous pressure get this to heal completely, but at current is improved without erythema or infection. She will return to clinic in 3 months for a wound check.

## 2016-11-10 ENCOUNTER — Other Ambulatory Visit: Payer: Self-pay | Admitting: Internal Medicine

## 2016-11-10 DIAGNOSIS — Z1231 Encounter for screening mammogram for malignant neoplasm of breast: Secondary | ICD-10-CM

## 2016-11-29 ENCOUNTER — Ambulatory Visit
Admission: RE | Admit: 2016-11-29 | Discharge: 2016-11-29 | Disposition: A | Discharge status: Home / Self Care | Payer: PPO | Source: Ambulatory Visit | Attending: Internal Medicine | Admitting: Internal Medicine

## 2016-11-29 DIAGNOSIS — Z1231 Encounter for screening mammogram for malignant neoplasm of breast: Secondary | ICD-10-CM | POA: Insufficient documentation

## 2017-01-02 ENCOUNTER — Encounter (INDEPENDENT_AMBULATORY_CARE_PROVIDER_SITE_OTHER): Payer: Self-pay | Admitting: Vascular Surgery

## 2017-01-02 ENCOUNTER — Ambulatory Visit (INDEPENDENT_AMBULATORY_CARE_PROVIDER_SITE_OTHER): Payer: PPO | Admitting: Vascular Surgery

## 2017-01-02 VITALS — BP 148/76 | HR 54 | Resp 14 | Ht 65.0 in | Wt 235.0 lb

## 2017-01-02 DIAGNOSIS — I83812 Varicose veins of left lower extremities with pain: Secondary | ICD-10-CM | POA: Diagnosis not present

## 2017-01-02 DIAGNOSIS — I10 Essential (primary) hypertension: Secondary | ICD-10-CM

## 2017-01-02 NOTE — Progress Notes (Signed)
MRN : [id number]  Brittany Lara is a 76 y.o. (1940-12-10) female who presents with chief complaint of  Chief Complaint  Patient presents with  . Follow-up  .  History of Present Illness: Patient returns today in follow up of Venous insufficiency and a small scab on her left anterior shin which has been present for several months. This remains present. It is not painful. She is on a significant bleeding or bruising. She has avoided getting it wet or doing any mildly traumatic things to the area. She denies fever or chills. She has chronic swelling in her lower extremities which is stable.  Current Outpatient Prescriptions  Medication Sig Dispense Refill  . aspirin EC 81 MG tablet Take 81 mg by mouth daily.    . Cholecalciferol (VITAMIN D3) 1000 units CAPS Take by mouth.    . furosemide (LASIX) 20 MG tablet Take 20 mg by mouth daily.    Marland Kitchen KLOR-CON M10 10 MEQ tablet Take 1 tablet by mouth daily.    Marland Kitchen lisinopril (PRINIVIL,ZESTRIL) 10 MG tablet Take 10 mg by mouth daily.    . naproxen sodium (ANAPROX) 220 MG tablet Take 220 mg by mouth 2 (two) times daily with a meal.    . PREVNAR 13 SUSP injection     . cetirizine (ZYRTEC) 10 MG tablet Take 1 tablet by mouth daily as needed.    . meloxicam (MOBIC) 7.5 MG tablet Take by mouth.     No current facility-administered medications for this visit.     Past Medical History:  Diagnosis Date  . Cancer ([place])    skin  . Hypertension     Past Surgical History:  Procedure Laterality Date  . BREAST BIOPSY Bilateral yrs ago   benign    Social History     Social History  Substance Use Topics  . Smoking status: Never Smoker  . Smokeless tobacco: Never Used  . Alcohol use No     Family History No bleeding or clotting disorders  No Known Allergies   REVIEW OF SYSTEMS(Negative unless checked)  Constitutional: [] Weight loss[] Fever[] Chills Cardiac:[] Chest pain[] Chest pressure[] Palpitations [] Shortness of  breath when laying flat [] Shortness of breath at rest [] Shortness of breath with exertion. Vascular: [] Pain in legs with walking[] Pain in legsat rest[] Pain in legs when laying flat [] Claudication [] Pain in feet when walking [] Pain in feet at rest [] Pain in feet when laying flat [] History of DVT [] Phlebitis [x] Swelling in legs [x] Varicose veins [] Non-healing ulcers Pulmonary: [] Uses home oxygen [] Productive cough[] Hemoptysis [] Wheeze [] COPD [] Asthma Neurologic: [] Dizziness [] Blackouts [] Seizures [] History of stroke [] History of TIA[] Aphasia [] Temporary blindness[] Dysphagia [] Weaknessor numbness in arms [] Weakness or numbnessin legs Musculoskeletal: [] Arthritis [] Joint swelling [] Joint pain [] Low back pain Hematologic:[] Easy bruising[] Easy bleeding [] Hypercoagulable state [] Anemic  Gastrointestinal:[] Blood in stool[] Vomiting blood[] Gastroesophageal reflux/heartburn[] Abdominal pain Genitourinary: [] Chronic kidney disease [] Difficulturination [] Frequenturination [] Burning with urination[] Hematuria Skin: [] Rashes [x] Ulcers [x] Wounds Psychological: [] History of anxiety[] History of major depression.     Physical Examination  BP (!) 148/76   Pulse (!) 54   Resp 14   Ht 5\' 5"  (1.651 m)   Wt 106.6 kg (235 lb)   BMI 39.11 kg/m  Gen:  WD/WN, NAD Head: [place]/AT, No temporalis wasting. Ear/Nose/Throat: Hearing grossly intact, nares w/o erythema or drainage, trachea midline Eyes: Conjunctiva clear. Sclera non-icteric Neck: Supple.  No JVD.  Pulmonary:  Good air movement, no use of accessory muscles.  Cardiac: RRR, normal S1, S2 Vascular: Diffuse varicosities are present throughout both lower extremities. Vessel Right Left  Radial Palpable Palpable  Musculoskeletal: M/S 5/5 throughout.  No deformity or atrophy. 1-2+ bilateral lower extremity  edema. Neurologic: Sensation grossly intact in extremities.  Symmetrical.  Speech is fluent.  Psychiatric: Judgment intact, Mood & affect appropriate for pt's clinical situation. Dermatologic: Small, less than 1 cm scab remains present on the left anterior lower shin area. No erythema or purulent drainage.      Labs No results found for this or any previous visit (from the past 2160 hour(s)).  Radiology No results found.    Assessment/Plan Essential hypertension, benign blood pressure control important in reducing the progression of atherosclerotic disease. On appropriate oral medications.  Varicose veins of leg with pain, left Patient will continue to dress the area as needed. We discussed the role of sclerotherapy or from sclerotherapy for the diffuse varicosities around the scab, but she is not really interested in this at this time. This is reasonable. I will see her back as needed.    Leotis Pain, MD  01/03/2017 4:24 PM    This note was created with Dragon medical transcription system.  Any errors from dictation are purely unintentional

## 2017-01-03 NOTE — Assessment & Plan Note (Signed)
Patient will continue to dress the area as needed. We discussed the role of sclerotherapy or from sclerotherapy for the diffuse varicosities around the scab, but she is not really interested in this at this time. This is reasonable. I will see her back as needed.

## 2017-02-27 DIAGNOSIS — L578 Other skin changes due to chronic exposure to nonionizing radiation: Secondary | ICD-10-CM | POA: Diagnosis not present

## 2017-02-27 DIAGNOSIS — Z872 Personal history of diseases of the skin and subcutaneous tissue: Secondary | ICD-10-CM | POA: Diagnosis not present

## 2017-02-27 DIAGNOSIS — L570 Actinic keratosis: Secondary | ICD-10-CM | POA: Diagnosis not present

## 2017-02-27 DIAGNOSIS — Z86018 Personal history of other benign neoplasm: Secondary | ICD-10-CM | POA: Diagnosis not present

## 2017-02-27 DIAGNOSIS — L821 Other seborrheic keratosis: Secondary | ICD-10-CM | POA: Diagnosis not present

## 2017-02-27 DIAGNOSIS — Z859 Personal history of malignant neoplasm, unspecified: Secondary | ICD-10-CM | POA: Diagnosis not present

## 2017-03-13 DIAGNOSIS — M5010 Cervical disc disorder with radiculopathy, unspecified cervical region: Secondary | ICD-10-CM | POA: Diagnosis not present

## 2017-04-11 DIAGNOSIS — M79674 Pain in right toe(s): Secondary | ICD-10-CM | POA: Diagnosis not present

## 2017-07-05 DIAGNOSIS — E782 Mixed hyperlipidemia: Secondary | ICD-10-CM | POA: Diagnosis not present

## 2017-07-05 DIAGNOSIS — Z0000 Encounter for general adult medical examination without abnormal findings: Secondary | ICD-10-CM | POA: Diagnosis not present

## 2017-07-12 DIAGNOSIS — Z79899 Other long term (current) drug therapy: Secondary | ICD-10-CM | POA: Diagnosis not present

## 2017-07-12 DIAGNOSIS — E782 Mixed hyperlipidemia: Secondary | ICD-10-CM | POA: Diagnosis not present

## 2017-07-12 DIAGNOSIS — Z0000 Encounter for general adult medical examination without abnormal findings: Secondary | ICD-10-CM | POA: Diagnosis not present

## 2017-07-12 DIAGNOSIS — I83893 Varicose veins of bilateral lower extremities with other complications: Secondary | ICD-10-CM | POA: Diagnosis not present

## 2017-07-24 DIAGNOSIS — M19041 Primary osteoarthritis, right hand: Secondary | ICD-10-CM | POA: Diagnosis not present

## 2017-07-24 DIAGNOSIS — M25562 Pain in left knee: Secondary | ICD-10-CM | POA: Diagnosis not present

## 2017-07-24 DIAGNOSIS — M25561 Pain in right knee: Secondary | ICD-10-CM | POA: Diagnosis not present

## 2017-07-24 DIAGNOSIS — M19042 Primary osteoarthritis, left hand: Secondary | ICD-10-CM | POA: Diagnosis not present

## 2017-11-26 ENCOUNTER — Other Ambulatory Visit: Payer: Self-pay | Admitting: Internal Medicine

## 2017-11-26 DIAGNOSIS — Z1231 Encounter for screening mammogram for malignant neoplasm of breast: Secondary | ICD-10-CM

## 2017-12-20 ENCOUNTER — Ambulatory Visit
Admission: RE | Admit: 2017-12-20 | Discharge: 2017-12-20 | Disposition: A | Discharge status: Home / Self Care | Payer: PPO | Source: Ambulatory Visit | Attending: Internal Medicine | Admitting: Internal Medicine

## 2017-12-20 DIAGNOSIS — Z1231 Encounter for screening mammogram for malignant neoplasm of breast: Secondary | ICD-10-CM | POA: Diagnosis not present

## 2017-12-21 DIAGNOSIS — G589 Mononeuropathy, unspecified: Secondary | ICD-10-CM | POA: Diagnosis not present

## 2018-03-05 DIAGNOSIS — L821 Other seborrheic keratosis: Secondary | ICD-10-CM | POA: Diagnosis not present

## 2018-03-05 DIAGNOSIS — L578 Other skin changes due to chronic exposure to nonionizing radiation: Secondary | ICD-10-CM | POA: Diagnosis not present

## 2018-03-05 DIAGNOSIS — Z872 Personal history of diseases of the skin and subcutaneous tissue: Secondary | ICD-10-CM | POA: Diagnosis not present

## 2018-03-05 DIAGNOSIS — Z85828 Personal history of other malignant neoplasm of skin: Secondary | ICD-10-CM | POA: Diagnosis not present

## 2018-03-05 DIAGNOSIS — D492 Neoplasm of unspecified behavior of bone, soft tissue, and skin: Secondary | ICD-10-CM | POA: Diagnosis not present

## 2018-03-05 DIAGNOSIS — Z1283 Encounter for screening for malignant neoplasm of skin: Secondary | ICD-10-CM | POA: Diagnosis not present

## 2018-03-05 DIAGNOSIS — Z86018 Personal history of other benign neoplasm: Secondary | ICD-10-CM | POA: Diagnosis not present

## 2018-03-06 DIAGNOSIS — Z23 Encounter for immunization: Secondary | ICD-10-CM | POA: Diagnosis not present

## 2018-03-06 DIAGNOSIS — S40812A Abrasion of left upper arm, initial encounter: Secondary | ICD-10-CM | POA: Diagnosis not present

## 2018-05-20 DIAGNOSIS — H40033 Anatomical narrow angle, bilateral: Secondary | ICD-10-CM | POA: Diagnosis not present

## 2018-06-10 DIAGNOSIS — C44319 Basal cell carcinoma of skin of other parts of face: Secondary | ICD-10-CM | POA: Diagnosis not present

## 2018-06-10 DIAGNOSIS — D485 Neoplasm of uncertain behavior of skin: Secondary | ICD-10-CM | POA: Diagnosis not present

## 2018-06-10 DIAGNOSIS — C44719 Basal cell carcinoma of skin of left lower limb, including hip: Secondary | ICD-10-CM | POA: Diagnosis not present

## 2018-06-17 DIAGNOSIS — H2511 Age-related nuclear cataract, right eye: Secondary | ICD-10-CM | POA: Diagnosis not present

## 2018-06-26 ENCOUNTER — Encounter: Payer: Self-pay | Admitting: *Deleted

## 2018-07-02 DIAGNOSIS — C4491 Basal cell carcinoma of skin, unspecified: Secondary | ICD-10-CM | POA: Diagnosis not present

## 2018-07-02 DIAGNOSIS — D2239 Melanocytic nevi of other parts of face: Secondary | ICD-10-CM | POA: Diagnosis not present

## 2018-07-09 DIAGNOSIS — E782 Mixed hyperlipidemia: Secondary | ICD-10-CM | POA: Diagnosis not present

## 2018-07-09 DIAGNOSIS — Z79899 Other long term (current) drug therapy: Secondary | ICD-10-CM | POA: Diagnosis not present

## 2018-07-11 ENCOUNTER — Ambulatory Visit
Admission: RE | Admit: 2018-07-11 | Discharge: 2018-07-11 | Disposition: A | Discharge status: Home / Self Care | Payer: PPO | Source: Ambulatory Visit | Attending: Ophthalmology | Admitting: Ophthalmology

## 2018-07-11 ENCOUNTER — Other Ambulatory Visit: Payer: Self-pay

## 2018-07-11 ENCOUNTER — Ambulatory Visit: Payer: PPO | Admitting: Anesthesiology

## 2018-07-11 ENCOUNTER — Encounter
Admission: RE | Disposition: A | Discharge status: Home / Self Care | Payer: Self-pay | Source: Ambulatory Visit | Attending: Ophthalmology

## 2018-07-11 DIAGNOSIS — Z7982 Long term (current) use of aspirin: Secondary | ICD-10-CM | POA: Insufficient documentation

## 2018-07-11 DIAGNOSIS — Z96659 Presence of unspecified artificial knee joint: Secondary | ICD-10-CM | POA: Diagnosis not present

## 2018-07-11 DIAGNOSIS — I10 Essential (primary) hypertension: Secondary | ICD-10-CM | POA: Diagnosis not present

## 2018-07-11 DIAGNOSIS — Z85828 Personal history of other malignant neoplasm of skin: Secondary | ICD-10-CM | POA: Diagnosis not present

## 2018-07-11 DIAGNOSIS — Z791 Long term (current) use of non-steroidal anti-inflammatories (NSAID): Secondary | ICD-10-CM | POA: Diagnosis not present

## 2018-07-11 DIAGNOSIS — I739 Peripheral vascular disease, unspecified: Secondary | ICD-10-CM | POA: Insufficient documentation

## 2018-07-11 DIAGNOSIS — H2511 Age-related nuclear cataract, right eye: Secondary | ICD-10-CM | POA: Diagnosis not present

## 2018-07-11 DIAGNOSIS — M1990 Unspecified osteoarthritis, unspecified site: Secondary | ICD-10-CM | POA: Insufficient documentation

## 2018-07-11 DIAGNOSIS — Z79899 Other long term (current) drug therapy: Secondary | ICD-10-CM | POA: Diagnosis not present

## 2018-07-11 HISTORY — PX: CATARACT EXTRACTION W/PHACO: SHX586

## 2018-07-11 HISTORY — DX: Unspecified osteoarthritis, unspecified site: M19.90

## 2018-07-11 HISTORY — DX: Cardiac murmur, unspecified: R01.1

## 2018-07-11 SURGERY — PHACOEMULSIFICATION, CATARACT, WITH IOL INSERTION
Anesthesia: Monitor Anesthesia Care | Site: Eye | Laterality: Right

## 2018-07-11 MED ORDER — ARMC OPHTHALMIC DILATING DROPS
OPHTHALMIC | Status: AC
  Filled 2018-07-11: qty 0.5

## 2018-07-11 MED ORDER — EPINEPHRINE PF 1 MG/ML IJ SOLN
INTRAOCULAR | Status: DC | PRN
  Administered 2018-07-11: 09:00:00 via OPHTHALMIC

## 2018-07-11 MED ORDER — NA CHONDROIT SULF-NA HYALURON 40-30 MG/ML IO SOLN
INTRAOCULAR | Status: DC | PRN
  Administered 2018-07-11: 0.5 mL via INTRAOCULAR

## 2018-07-11 MED ORDER — CARBACHOL 0.01 % IO SOLN
INTRAOCULAR | Status: DC | PRN
  Administered 2018-07-11: 0.5 mL via INTRAOCULAR

## 2018-07-11 MED ORDER — ONDANSETRON HCL 4 MG/2ML IJ SOLN
INTRAMUSCULAR | Status: DC | PRN
  Administered 2018-07-11: 4 mg via INTRAVENOUS

## 2018-07-11 MED ORDER — SODIUM CHLORIDE 0.9 % IV SOLN
INTRAVENOUS | Status: DC
  Administered 2018-07-11: 07:00:00 via INTRAVENOUS

## 2018-07-11 MED ORDER — MIDAZOLAM HCL 2 MG/2ML IJ SOLN
INTRAMUSCULAR | Status: DC | PRN
  Administered 2018-07-11: .5 mg via INTRAVENOUS

## 2018-07-11 MED ORDER — PHENYLEPHRINE HCL 10 % OP SOLN
OPHTHALMIC | Status: AC
  Administered 2018-07-11: 1 [drp] via OPHTHALMIC
  Filled 2018-07-11: qty 5

## 2018-07-11 MED ORDER — TRYPAN BLUE 0.06 % OP SOLN
OPHTHALMIC | Status: DC | PRN
  Administered 2018-07-11: 0.5 mL via INTRAOCULAR

## 2018-07-11 MED ORDER — MANNITOL 25 % IV SOLN
INTRAVENOUS | Status: DC | PRN
  Administered 2018-07-11: 12.5 g via INTRAVENOUS

## 2018-07-11 MED ORDER — CYCLOPENTOLATE HCL 2 % OP SOLN
OPHTHALMIC | Status: AC
  Filled 2018-07-11: qty 2

## 2018-07-11 MED ORDER — MOXIFLOXACIN HCL 0.5 % OP SOLN
OPHTHALMIC | Status: AC
  Filled 2018-07-11: qty 3

## 2018-07-11 MED ORDER — TETRACAINE HCL 0.5 % OP SOLN
OPHTHALMIC | Status: AC
  Administered 2018-07-11: 1 [drp] via OPHTHALMIC
  Filled 2018-07-11: qty 4

## 2018-07-11 MED ORDER — LIDOCAINE HCL (PF) 4 % IJ SOLN
INTRAOCULAR | Status: DC | PRN
  Administered 2018-07-11: 4 mL via OPHTHALMIC

## 2018-07-11 MED ORDER — TETRACAINE HCL 0.5 % OP SOLN
1.0000 [drp] | OPHTHALMIC | Status: DC | PRN
  Administered 2018-07-11 (×3): 1 [drp] via OPHTHALMIC

## 2018-07-11 MED ORDER — CYCLOPENTOLATE HCL 2 % OP SOLN
1.0000 [drp] | Freq: Once | OPHTHALMIC | Status: AC
  Administered 2018-07-11: 1 [drp] via OPHTHALMIC

## 2018-07-11 MED ORDER — LIDOCAINE HCL (PF) 4 % IJ SOLN
INTRAMUSCULAR | Status: AC
  Filled 2018-07-11: qty 5

## 2018-07-11 MED ORDER — MOXIFLOXACIN HCL 0.5 % OP SOLN
OPHTHALMIC | Status: DC | PRN
  Administered 2018-07-11: 0.2 mL via OPHTHALMIC

## 2018-07-11 MED ORDER — MOXIFLOXACIN HCL 0.5 % OP SOLN: 1.0000 [drp] | OPHTHALMIC | Status: DC | PRN

## 2018-07-11 MED ORDER — NA HYALUR & NA CHOND-NA HYALUR 0.55-0.5 ML IO KIT
PACK | INTRAOCULAR | Status: AC
  Filled 2018-07-11: qty 1.05

## 2018-07-11 MED ORDER — PHENYLEPHRINE HCL 10 % OP SOLN
1.0000 [drp] | Freq: Once | OPHTHALMIC | Status: AC
  Administered 2018-07-11: 1 [drp] via OPHTHALMIC

## 2018-07-11 MED ORDER — NA HYALUR & NA CHOND-NA HYALUR 0.55-0.5 ML IO KIT
PACK | INTRAOCULAR | Status: DC | PRN
  Administered 2018-07-11: 1 via OPHTHALMIC

## 2018-07-11 MED ORDER — MIDAZOLAM HCL 2 MG/2ML IJ SOLN
INTRAMUSCULAR | Status: AC
  Filled 2018-07-11: qty 2

## 2018-07-11 MED ORDER — POVIDONE-IODINE 5 % OP SOLN
OPHTHALMIC | Status: DC | PRN
  Administered 2018-07-11 (×2): 1 via OPHTHALMIC

## 2018-07-11 MED ORDER — TRYPAN BLUE 0.06 % OP SOLN
OPHTHALMIC | Status: AC
  Filled 2018-07-11: qty 0.5

## 2018-07-11 MED ORDER — FENTANYL CITRATE (PF) 100 MCG/2ML IJ SOLN
INTRAMUSCULAR | Status: AC
  Filled 2018-07-11: qty 2

## 2018-07-11 MED ORDER — POVIDONE-IODINE 5 % OP SOLN
OPHTHALMIC | Status: AC
  Filled 2018-07-11: qty 30

## 2018-07-11 MED ORDER — FENTANYL CITRATE (PF) 100 MCG/2ML IJ SOLN
INTRAMUSCULAR | Status: DC | PRN
  Administered 2018-07-11: 25 ug via INTRAVENOUS

## 2018-07-11 MED ORDER — ARMC OPHTHALMIC DILATING DROPS
1.0000 "application " | OPHTHALMIC | Status: AC
  Administered 2018-07-11 (×3): 1 via OPHTHALMIC

## 2018-07-11 MED ORDER — MANNITOL 25 % IV SOLN
INTRAVENOUS | Status: AC
  Filled 2018-07-11: qty 50

## 2018-07-11 MED ORDER — EPINEPHRINE PF 1 MG/ML IJ SOLN
INTRAMUSCULAR | Status: AC
  Filled 2018-07-11: qty 2

## 2018-07-11 SURGICAL SUPPLY — 18 items
DISSECTOR HYDRO NUCLEUS 50X22 (MISCELLANEOUS) ×8 IMPLANT
GLOVE BIOGEL M 6.5 STRL (GLOVE) ×2 IMPLANT
GOWN STRL REUS W/ TWL LRG LVL3 (GOWN DISPOSABLE) ×1 IMPLANT
GOWN STRL REUS W/ TWL XL LVL3 (GOWN DISPOSABLE) ×1 IMPLANT
GOWN STRL REUS W/TWL LRG LVL3 (GOWN DISPOSABLE) ×2
GOWN STRL REUS W/TWL XL LVL3 (GOWN DISPOSABLE) ×2
KNIFE 45D UP 2.3 (MISCELLANEOUS) ×2 IMPLANT
LABEL CATARACT MEDS ST (LABEL) ×2 IMPLANT
LENS IOL ACRSF IQ ULTRA 24.0 (Intraocular Lens) ×1 IMPLANT
LENS IOL ACRYSOF IQ 24.0 (Intraocular Lens) ×2 IMPLANT
PACK CATARACT (MISCELLANEOUS) ×2 IMPLANT
PACK CATARACT KING (MISCELLANEOUS) ×2 IMPLANT
PACK EYE AFTER SURG (MISCELLANEOUS) ×2 IMPLANT
SOL BSS BAG (MISCELLANEOUS) ×2
SOLUTION BSS BAG (MISCELLANEOUS) ×1 IMPLANT
SYR 5ML LL (SYRINGE) ×2 IMPLANT
WATER STERILE IRR 250ML POUR (IV SOLUTION) ×2 IMPLANT
WIPE NON LINTING 3.25X3.25 (MISCELLANEOUS) ×2 IMPLANT

## 2018-07-11 NOTE — Op Note (Signed)
  PREOPERATIVE DIAGNOSIS:  Nuclear sclerotic cataract of the RIGHT eye.   POSTOPERATIVE DIAGNOSIS:  Nuclear sclerotic cataract of the RIGHT eye.   OPERATIVE PROCEDURE: Cataract surgery OD   SURGEON:  Marchia Meiers, MD.   ANESTHESIA:  Anesthesiologist: Piscitello, Precious Haws, MD CRNA: Doreen Salvage, CRNA; Allean Found, CRNA  1.      Managed anesthesia care. 2.     0.51ml of Shugarcaine was instilled following the paracentesis   COMPLICATIONS:  None.   TECHNIQUE:   Divide and conquer   DESCRIPTION OF PROCEDURE:  The patient was examined and consented in the preoperative holding area where the aforementioned topical anesthesia was applied to the RIGHT eye and then brought back to the Operating Room where the left eye was prepped and draped in the usual sterile ophthalmic fashion and a lid speculum was placed. A paracentesis was created with the side port blade, the anterior chamber was washed out with trypan blue to stain the anterior capsule, and the anterior chamber was filled with viscoelastic. A near clear corneal incision was performed with the steel keratome. A continuous curvilinear capsulorrhexis was performed with a cystotome followed by the capsulorrhexis forceps. Hydrodissection and hydrodelineation were carried out with BSS on a blunt cannula. The lens was removed in a divide and conquer  technique and the remaining cortical material was removed with the irrigation-aspiration handpiece. The capsular bag was inflated with viscoelastic and the lens was placed in the capsular bag without complication. The remaining viscoelastic was removed from the eye with the irrigation-aspiration handpiece. The wounds were hydrated. The anterior chamber was flushed and the eye was inflated to physiologic pressure. 0.34ml Vigamox was placed in the anterior chamber. The wounds were found to be water tight. The eye was dressed with Vigamox. The patient was given protective glasses to wear throughout the day  and a shield with which to sleep tonight. The patient was also given drops with which to begin a drop regimen today and will follow-up with me in one day. Implant Name Type Inv. Item Serial No. Manufacturer Lot No. LRB No. Used  LENS IOL ACRYSOF IQ 24.0 - [id number] Intraocular Lens LENS IOL ACRYSOF IQ 24.0 [id number] ALCON  Right 1    Procedure(s) with comments: CATARACT EXTRACTION PHACO AND INTRAOCULAR LENS PLACEMENT (IOC) RIGHT EYE (Right) - [place] 01:13.7 CDE 9.93 Fluid pack Lot # [id number]  Electronically signed: Marchia Meiers 07/11/2018 9:43 AM

## 2018-07-11 NOTE — Anesthesia Post-op Follow-up Note (Signed)
Anesthesia QCDR form completed.        

## 2018-07-11 NOTE — Transfer of Care (Signed)
Immediate Anesthesia Transfer of Care Note  Patient: Brittany Lara  Procedure(s) Performed: CATARACT EXTRACTION PHACO AND INTRAOCULAR LENS PLACEMENT (IOC) RIGHT EYE (Right Eye)  Patient Location: PACU  Anesthesia Type:MAC  Level of Consciousness: awake and alert   Airway & Oxygen Therapy: Patient Spontanous Breathing  Post-op Assessment: Report given to RN and Post -op Vital signs reviewed and stable  Post vital signs: Reviewed and stable  Last Vitals:  Vitals Value Taken Time  BP    Temp    Pulse    Resp    SpO2      Last Pain:  Vitals:   07/11/18 0653  TempSrc: Temporal  PainSc: 0-No pain         Complications: No apparent anesthesia complications

## 2018-07-11 NOTE — Anesthesia Postprocedure Evaluation (Signed)
Anesthesia Post Note  Patient: Brittany Lara  Procedure(s) Performed: CATARACT EXTRACTION PHACO AND INTRAOCULAR LENS PLACEMENT (IOC) RIGHT EYE (Right Eye)  Patient location during evaluation: Short Stay Anesthesia Type: MAC Level of consciousness: awake, awake and alert and oriented Pain management: pain level controlled Vital Signs Assessment: post-procedure vital signs reviewed and stable Respiratory status: spontaneous breathing Cardiovascular status: blood pressure returned to baseline Postop Assessment: no headache Anesthetic complications: no     Last Vitals:  Vitals:   07/11/18 0653 07/11/18 0944  BP: (!) 143/73 (!) 143/62  Pulse: 77 (!) 53  Resp: 18 17  Temp: (!) 36.3 C (!) 36.2 C  SpO2: 100% 100%    Last Pain:  Vitals:   07/11/18 0944  TempSrc: Temporal  PainSc: 0-No pain                 Lesbia Ottaway,  Tzvi Economou G     

## 2018-07-11 NOTE — H&P (Signed)
   I have reviewed the patient's H&P and agree with its findings. There have been no interval changes.  Tandrea Kommer MD Ophthalmology 

## 2018-07-11 NOTE — Anesthesia Preprocedure Evaluation (Signed)
Anesthesia Evaluation  Patient identified by MRN, date of birth, ID band Patient awake    Reviewed: Allergy & Precautions, H&P , NPO status , Patient's Chart, lab work & pertinent test results  Airway Mallampati: III  TM Distance: >3 FB Neck ROM: limited    Dental  (+) Chipped, Upper Dentures, Lower Dentures   Pulmonary neg pulmonary ROS, neg shortness of breath,           Cardiovascular Exercise Tolerance: Good hypertension, (-) angina+ Peripheral Vascular Disease  (-) Past MI and (-) DOE + Valvular Problems/Murmurs      Neuro/Psych negative neurological ROS  negative psych ROS   GI/Hepatic negative GI ROS, Neg liver ROS, neg GERD  ,  Endo/Other  negative endocrine ROS  Renal/GU      Musculoskeletal  (+) Arthritis ,   Abdominal   Peds  Hematology negative hematology ROS (+)   Anesthesia Other Findings Past Medical History: No date: Arthritis No date: Cancer ([facility])     Comment:  skin No date: Heart murmur No date: Hypertension  Past Surgical History: No date: ABDOMINAL HYSTERECTOMY yrs ago: BREAST EXCISIONAL BIOPSY; Bilateral     Comment:  benign No date: BREAST SURGERY No date: CHOLECYSTECTOMY No date: JOINT REPLACEMENT  BMI    Body Mass Index:  35.78 kg/m      Reproductive/Obstetrics negative OB ROS                             Anesthesia Physical Anesthesia Plan  ASA: III  Anesthesia Plan: MAC   Post-op Pain Management:    Induction: Intravenous  PONV Risk Score and Plan:   Airway Management Planned: Natural Airway and Nasal Cannula  Additional Equipment:   Intra-op Plan:   Post-operative Plan:   Informed Consent: I have reviewed the patients History and Physical, chart, labs and discussed the procedure including the risks, benefits and alternatives for the proposed anesthesia with the patient or authorized representative who has indicated his/her  understanding and acceptance.     Dental Advisory Given  Plan Discussed with: Anesthesiologist, CRNA and Surgeon  Anesthesia Plan Comments: (Patient consented for risks of anesthesia including but not limited to:  - adverse reactions to medications - damage to teeth, lips or other oral mucosa - sore throat or hoarseness - Damage to heart, brain, lungs or loss of life  Patient voiced understanding.)        Anesthesia Quick Evaluation

## 2018-07-11 NOTE — Anesthesia Postprocedure Evaluation (Signed)
Anesthesia Post Note  Patient: Brittany Lara  Procedure(s) Performed: CATARACT EXTRACTION PHACO AND INTRAOCULAR LENS PLACEMENT (IOC) RIGHT EYE (Right Eye)  Patient location during evaluation: Short Stay Anesthesia Type: MAC Level of consciousness: awake, awake and alert and oriented Pain management: pain level controlled Vital Signs Assessment: post-procedure vital signs reviewed and stable Respiratory status: spontaneous breathing Cardiovascular status: blood pressure returned to baseline Postop Assessment: no headache Anesthetic complications: no     Last Vitals:  Vitals:   07/11/18 0653 07/11/18 0944  BP: (!) 143/73 (!) 143/62  Pulse: 77 (!) 53  Resp: 18 17  Temp: (!) 36.3 C (!) 36.2 C  SpO2: 100% 100%    Last Pain:  Vitals:   07/11/18 0944  TempSrc: Temporal  PainSc: 0-No pain                 Buckner [place]

## 2018-07-11 NOTE — Discharge Instructions (Signed)
Eye Surgery Discharge Instructions    Expect mild scratchy sensation or mild soreness. DO NOT RUB YOUR EYE!  The day of surgery:  Minimal physical activity, but bed rest is not required  No reading, computer work, or close hand work  No bending, lifting, or straining.  May watch TV  For 24 hours:  No driving, legal decisions, or alcoholic beverages  Safety precautions  Eat anything you prefer: It is better to start with liquids, then soup then solid foods.  _____ Eye patch should be worn until postoperative exam tomorrow.  ____ Solar shield eyeglasses should be worn for comfort in the sunlight/patch while sleeping  Resume all regular medications including aspirin or Coumadin if these were discontinued prior to surgery. You may shower, bathe, shave, or wash your hair. Tylenol may be taken for mild discomfort.  Call your doctor if you experience significant pain, nausea, or vomiting, fever > 101 or other signs of infection. [phone] or [phone] Specific instructions:  Follow-up Information    Marchia Meiers, MD Follow up on 07/12/2018.   Specialty:  Ophthalmology Why:  @ 10:45 am Contact information: [address] [place] [address] [phone]

## 2018-07-16 DIAGNOSIS — M5116 Intervertebral disc disorders with radiculopathy, lumbar region: Secondary | ICD-10-CM | POA: Diagnosis not present

## 2018-07-16 DIAGNOSIS — Z0000 Encounter for general adult medical examination without abnormal findings: Secondary | ICD-10-CM | POA: Diagnosis not present

## 2018-07-16 DIAGNOSIS — E782 Mixed hyperlipidemia: Secondary | ICD-10-CM | POA: Diagnosis not present

## 2018-07-16 DIAGNOSIS — E538 Deficiency of other specified B group vitamins: Secondary | ICD-10-CM | POA: Diagnosis not present

## 2018-07-19 DIAGNOSIS — H2512 Age-related nuclear cataract, left eye: Secondary | ICD-10-CM | POA: Diagnosis not present

## 2018-07-31 ENCOUNTER — Encounter: Payer: Self-pay | Admitting: *Deleted

## 2018-08-01 DIAGNOSIS — M5116 Intervertebral disc disorders with radiculopathy, lumbar region: Secondary | ICD-10-CM | POA: Diagnosis not present

## 2018-08-08 ENCOUNTER — Ambulatory Visit: Payer: PPO | Admitting: Certified Registered"

## 2018-08-08 ENCOUNTER — Ambulatory Visit
Admission: RE | Admit: 2018-08-08 | Discharge: 2018-08-08 | Disposition: A | Discharge status: Home / Self Care | Payer: PPO | Attending: Ophthalmology | Admitting: Ophthalmology

## 2018-08-08 ENCOUNTER — Other Ambulatory Visit: Payer: Self-pay

## 2018-08-08 ENCOUNTER — Encounter: Payer: Self-pay | Admitting: Emergency Medicine

## 2018-08-08 ENCOUNTER — Encounter
Admission: RE | Disposition: A | Discharge status: Home / Self Care | Payer: Self-pay | Source: Home / Self Care | Attending: Ophthalmology

## 2018-08-08 DIAGNOSIS — Z6835 Body mass index (BMI) 35.0-35.9, adult: Secondary | ICD-10-CM | POA: Diagnosis not present

## 2018-08-08 DIAGNOSIS — I10 Essential (primary) hypertension: Secondary | ICD-10-CM | POA: Insufficient documentation

## 2018-08-08 DIAGNOSIS — Z9049 Acquired absence of other specified parts of digestive tract: Secondary | ICD-10-CM | POA: Insufficient documentation

## 2018-08-08 DIAGNOSIS — Z85828 Personal history of other malignant neoplasm of skin: Secondary | ICD-10-CM | POA: Diagnosis not present

## 2018-08-08 DIAGNOSIS — M1990 Unspecified osteoarthritis, unspecified site: Secondary | ICD-10-CM | POA: Diagnosis not present

## 2018-08-08 DIAGNOSIS — Z96653 Presence of artificial knee joint, bilateral: Secondary | ICD-10-CM | POA: Diagnosis not present

## 2018-08-08 DIAGNOSIS — H2512 Age-related nuclear cataract, left eye: Secondary | ICD-10-CM | POA: Diagnosis not present

## 2018-08-08 DIAGNOSIS — E669 Obesity, unspecified: Secondary | ICD-10-CM | POA: Diagnosis not present

## 2018-08-08 DIAGNOSIS — Z90710 Acquired absence of both cervix and uterus: Secondary | ICD-10-CM | POA: Insufficient documentation

## 2018-08-08 DIAGNOSIS — R011 Cardiac murmur, unspecified: Secondary | ICD-10-CM | POA: Insufficient documentation

## 2018-08-08 DIAGNOSIS — Z9841 Cataract extraction status, right eye: Secondary | ICD-10-CM | POA: Insufficient documentation

## 2018-08-08 HISTORY — PX: CATARACT EXTRACTION W/PHACO: SHX586

## 2018-08-08 SURGERY — PHACOEMULSIFICATION, CATARACT, WITH IOL INSERTION
Anesthesia: Monitor Anesthesia Care | Site: Eye | Laterality: Left

## 2018-08-08 MED ORDER — FENTANYL CITRATE (PF) 100 MCG/2ML IJ SOLN
INTRAMUSCULAR | Status: DC | PRN
  Administered 2018-08-08 (×2): 25 ug via INTRAVENOUS

## 2018-08-08 MED ORDER — TETRACAINE HCL 0.5 % OP SOLN
1.0000 [drp] | OPHTHALMIC | Status: AC | PRN
  Administered 2018-08-08 (×3): 1 [drp] via OPHTHALMIC

## 2018-08-08 MED ORDER — SODIUM CHLORIDE 0.9 % IV SOLN
INTRAVENOUS | Status: DC
  Administered 2018-08-08: 08:00:00 via INTRAVENOUS

## 2018-08-08 MED ORDER — NA HYALUR & NA CHOND-NA HYALUR 0.55-0.5 ML IO KIT
PACK | INTRAOCULAR | Status: AC
  Filled 2018-08-08: qty 1.05

## 2018-08-08 MED ORDER — MOXIFLOXACIN HCL 0.5 % OP SOLN: 1.0000 [drp] | OPHTHALMIC | Status: DC | PRN

## 2018-08-08 MED ORDER — MOXIFLOXACIN HCL 0.5 % OP SOLN
OPHTHALMIC | Status: DC | PRN
  Administered 2018-08-08: 0.2 mL via OPHTHALMIC

## 2018-08-08 MED ORDER — ARMC OPHTHALMIC DILATING DROPS
1.0000 "application " | OPHTHALMIC | Status: AC
  Administered 2018-08-08 (×3): 1 via OPHTHALMIC

## 2018-08-08 MED ORDER — TETRACAINE HCL 0.5 % OP SOLN
OPHTHALMIC | Status: AC
  Administered 2018-08-08: 1 [drp] via OPHTHALMIC
  Filled 2018-08-08: qty 4

## 2018-08-08 MED ORDER — NA CHONDROIT SULF-NA HYALURON 40-17 MG/ML IO SOLN
INTRAOCULAR | Status: DC | PRN
  Administered 2018-08-08: 1 mL via INTRAOCULAR

## 2018-08-08 MED ORDER — CARBACHOL 0.01 % IO SOLN
INTRAOCULAR | Status: DC | PRN
  Administered 2018-08-08: 0.5 mL via INTRAOCULAR

## 2018-08-08 MED ORDER — SODIUM HYALURONATE 23 MG/ML IO SOLN
INTRAOCULAR | Status: DC | PRN
  Administered 2018-08-08: 0.6 mL via INTRAOCULAR

## 2018-08-08 MED ORDER — NEOMYCIN-POLYMYXIN-DEXAMETH 3.5-10000-0.1 OP OINT
TOPICAL_OINTMENT | OPHTHALMIC | Status: AC
  Filled 2018-08-08: qty 3.5

## 2018-08-08 MED ORDER — MOXIFLOXACIN HCL 0.5 % OP SOLN
OPHTHALMIC | Status: AC
  Filled 2018-08-08: qty 3

## 2018-08-08 MED ORDER — DORZOLAMIDE HCL-TIMOLOL MAL 2-0.5 % OP SOLN
OPHTHALMIC | Status: DC | PRN
  Administered 2018-08-08: 1 [drp] via OPHTHALMIC

## 2018-08-08 MED ORDER — LIDOCAINE HCL (PF) 4 % IJ SOLN
INTRAOCULAR | Status: DC | PRN
  Administered 2018-08-08: 4 mL via OPHTHALMIC

## 2018-08-08 MED ORDER — NEOMYCIN-POLYMYXIN-DEXAMETH 3.5-10000-0.1 OP OINT
TOPICAL_OINTMENT | OPHTHALMIC | Status: DC | PRN
  Administered 2018-08-08: 1 via OPHTHALMIC

## 2018-08-08 MED ORDER — NA CHONDROIT SULF-NA HYALURON 40-17 MG/ML IO SOLN
INTRAOCULAR | Status: AC
  Filled 2018-08-08: qty 1

## 2018-08-08 MED ORDER — ARMC OPHTHALMIC DILATING DROPS
OPHTHALMIC | Status: AC
  Administered 2018-08-08: 1 via OPHTHALMIC
  Filled 2018-08-08: qty 0.5

## 2018-08-08 MED ORDER — NA HYALUR & NA CHOND-NA HYALUR 0.55-0.5 ML IO KIT
PACK | INTRAOCULAR | Status: DC | PRN
  Administered 2018-08-08: 1 via OPHTHALMIC

## 2018-08-08 MED ORDER — MIDAZOLAM HCL 2 MG/2ML IJ SOLN
INTRAMUSCULAR | Status: DC | PRN
  Administered 2018-08-08 (×2): 0.5 mg via INTRAVENOUS

## 2018-08-08 MED ORDER — FENTANYL CITRATE (PF) 100 MCG/2ML IJ SOLN
INTRAMUSCULAR | Status: AC
  Filled 2018-08-08: qty 2

## 2018-08-08 MED ORDER — TRYPAN BLUE 0.06 % OP SOLN
OPHTHALMIC | Status: AC
  Filled 2018-08-08: qty 0.5

## 2018-08-08 MED ORDER — POVIDONE-IODINE 5 % OP SOLN
OPHTHALMIC | Status: DC | PRN
  Administered 2018-08-08: 1 via OPHTHALMIC

## 2018-08-08 MED ORDER — MIDAZOLAM HCL 2 MG/2ML IJ SOLN
INTRAMUSCULAR | Status: AC
  Filled 2018-08-08: qty 2

## 2018-08-08 MED ORDER — EPINEPHRINE PF 1 MG/ML IJ SOLN
INTRAOCULAR | Status: DC | PRN
  Administered 2018-08-08: 10:00:00 via OPHTHALMIC

## 2018-08-08 MED ORDER — EPINEPHRINE PF 1 MG/ML IJ SOLN
INTRAMUSCULAR | Status: AC
  Filled 2018-08-08: qty 2

## 2018-08-08 MED ORDER — TRYPAN BLUE 0.06 % OP SOLN
OPHTHALMIC | Status: DC | PRN
  Administered 2018-08-08: 0.5 mL via INTRAOCULAR

## 2018-08-08 MED ORDER — LIDOCAINE HCL (PF) 4 % IJ SOLN
INTRAMUSCULAR | Status: AC
  Filled 2018-08-08: qty 5

## 2018-08-08 SURGICAL SUPPLY — 18 items
DISSECTOR HYDRO NUCLEUS 50X22 (MISCELLANEOUS) ×8 IMPLANT
GLOVE BIOGEL M 6.5 STRL (GLOVE) ×2 IMPLANT
GOWN STRL REUS W/ TWL LRG LVL3 (GOWN DISPOSABLE) ×1 IMPLANT
GOWN STRL REUS W/ TWL XL LVL3 (GOWN DISPOSABLE) ×1 IMPLANT
GOWN STRL REUS W/TWL LRG LVL3 (GOWN DISPOSABLE) ×2
GOWN STRL REUS W/TWL XL LVL3 (GOWN DISPOSABLE) ×2
KNIFE 45D UP 2.3 (MISCELLANEOUS) ×2 IMPLANT
LABEL CATARACT MEDS ST (LABEL) ×2 IMPLANT
LENS IOL ACRSF IQ ULTRA 24.5 (Intraocular Lens) ×1 IMPLANT
LENS IOL ACRYSOF IQ 24.5 (Intraocular Lens) ×2 IMPLANT
PACK CATARACT (MISCELLANEOUS) ×2 IMPLANT
PACK CATARACT KING (MISCELLANEOUS) ×2 IMPLANT
PACK EYE AFTER SURG (MISCELLANEOUS) ×2 IMPLANT
SOL BSS BAG (MISCELLANEOUS) ×2
SOLUTION BSS BAG (MISCELLANEOUS) ×1 IMPLANT
SYR 5ML LL (SYRINGE) ×2 IMPLANT
WATER STERILE IRR 250ML POUR (IV SOLUTION) ×2 IMPLANT
WIPE NON LINTING 3.25X3.25 (MISCELLANEOUS) ×2 IMPLANT

## 2018-08-08 NOTE — Op Note (Signed)
  PREOPERATIVE DIAGNOSIS:  Nuclear sclerotic cataract of the LEFT eye.   POSTOPERATIVE DIAGNOSIS:  Nuclear sclerotic cataract of the LEFT eye.   OPERATIVE PROCEDURE: Cataract surgery OS   SURGEON:  Marchia Meiers, MD.   ANESTHESIA:  Anesthesiologist: Emmie Niemann, MD CRNA: Lavone Orn, CRNA  1.      Managed anesthesia care. 2.     0.29ml of Shugarcaine was instilled following the paracentesis   COMPLICATIONS:  None.   TECHNIQUE:   Divide and conquer   DESCRIPTION OF PROCEDURE:  The patient was examined and consented in the preoperative holding area where the aforementioned topical anesthesia was applied to the LEFT eye and then brought back to the Operating Room where the left eye was prepped and draped in the usual sterile ophthalmic fashion and a lid speculum was placed. A paracentesis was created with the side port blade, the anterior chamber was washed out with trypan blue to stain the anterior capsule, and the anterior chamber was filled with viscoelastic. A near clear corneal incision was performed with the steel keratome. A continuous curvilinear capsulorrhexis was performed with a cystotome followed by the capsulorrhexis forceps. Hydrodissection and hydrodelineation were carried out with BSS on a blunt cannula. The lens was removed in a divide and conquer  technique and the remaining cortical material was removed with the irrigation-aspiration handpiece. The capsular bag was inflated with viscoelastic and the lens was placed in the capsular bag without complication. The remaining viscoelastic was removed from the eye with the irrigation-aspiration handpiece. The wounds were hydrated. The anterior chamber was flushed and the eye was inflated to physiologic pressure. 0.4ml Vigamox was placed in the anterior chamber. The wounds were found to be water tight. The eye was dressed with maxitrol ointment and a protective patch was placed over the eye.The patient was also given drops  with which to begin a drop regimen today and will follow-up with me in one day. Implant Name Type Inv. Item Serial No. Manufacturer Lot No. LRB No. Used  LENS IOL ACRYSOF IQ 24.5 - [id number] Intraocular Lens LENS IOL ACRYSOF IQ 24.5 [id number] ALCON  Left 1    Procedure(s) with comments: CATARACT EXTRACTION PHACO AND INTRAOCULAR LENS PLACEMENT (IOC) LEFT (Left) - [place] 01:29.2 CDE 11.56 Fluid Pack Lot # [id number]  Electronically signed: Marchia Meiers 08/08/2018 10:36 AM

## 2018-08-08 NOTE — H&P (Signed)
   I have reviewed the patient's H&P and agree with its findings. There have been no interval changes.  Traeton Bordas MD Ophthalmology 

## 2018-08-08 NOTE — Transfer of Care (Addendum)
Immediate Anesthesia Transfer of Care Note  Patient: Brittany Lara  Procedure(s) Performed: CATARACT EXTRACTION PHACO AND INTRAOCULAR LENS PLACEMENT (IOC) LEFT (Left Eye)  Patient Location: PACU  Anesthesia Type:MAC  Level of Consciousness: awake  Airway & Oxygen Therapy: Patient Spontanous Breathing  Post-op Assessment: Report given to RN and Post -op Vital signs reviewed and stable  Post vital signs: stable  Last Vitals:  Vitals Value Taken Time  BP    Temp 36.6 C 08/08/2018 10:39 AM  Pulse    Resp 18 08/08/2018 10:39 AM  SpO2      Last Pain:  Vitals:   08/08/18 1039  TempSrc: Oral  PainSc: 0-No pain         Complications: No apparent anesthesia complications

## 2018-08-08 NOTE — Anesthesia Post-op Follow-up Note (Signed)
Anesthesia QCDR form completed.        

## 2018-08-08 NOTE — Anesthesia Preprocedure Evaluation (Signed)
Anesthesia Evaluation  Patient identified by MRN, date of birth, ID band Patient awake    Reviewed: Allergy & Precautions, NPO status , Patient's Chart, lab work & pertinent test results  History of Anesthesia Complications Negative for: history of anesthetic complications  Airway Mallampati: II  TM Distance: >3 FB Neck ROM: Full    Dental no notable dental hx.    Pulmonary neg pulmonary ROS, neg sleep apnea, neg COPD,    breath sounds clear to auscultation- rhonchi (-) wheezing      Cardiovascular hypertension, Pt. on medications (-) CAD, (-) Past MI, (-) Cardiac Stents and (-) CABG  Rhythm:Regular Rate:Normal - Systolic murmurs and - Diastolic murmurs    Neuro/Psych neg Seizures negative neurological ROS  negative psych ROS   GI/Hepatic negative GI ROS, Neg liver ROS,   Endo/Other  negative endocrine ROSneg diabetes  Renal/GU negative Renal ROS     Musculoskeletal  (+) Arthritis ,   Abdominal (+) + obese,   Peds  Hematology negative hematology ROS (+)   Anesthesia Other Findings Past Medical History: No date: Arthritis No date: Cancer ([place])     Comment:  skin No date: Heart murmur No date: Hypertension   Reproductive/Obstetrics                             Anesthesia Physical Anesthesia Plan  ASA: II  Anesthesia Plan: MAC   Post-op Pain Management:    Induction: Intravenous  PONV Risk Score and Plan: 2 and Midazolam  Airway Management Planned: Natural Airway  Additional Equipment:   Intra-op Plan:   Post-operative Plan:   Informed Consent: I have reviewed the patients History and Physical, chart, labs and discussed the procedure including the risks, benefits and alternatives for the proposed anesthesia with the patient or authorized representative who has indicated his/her understanding and acceptance.       Plan Discussed with: CRNA and  Anesthesiologist  Anesthesia Plan Comments:         Anesthesia Quick Evaluation

## 2018-08-08 NOTE — Anesthesia Postprocedure Evaluation (Signed)
Anesthesia Post Note  Patient: Brittany Lara  Procedure(s) Performed: CATARACT EXTRACTION PHACO AND INTRAOCULAR LENS PLACEMENT ([place]) LEFT (Left Eye)  Patient location during evaluation: Phase II Anesthesia Type: MAC Level of consciousness: awake Pain management: pain level controlled Vital Signs Assessment: post-procedure vital signs reviewed and stable Respiratory status: spontaneous breathing Cardiovascular status: blood pressure returned to baseline and stable Postop Assessment: no apparent nausea or vomiting and adequate PO intake Anesthetic complications: no     Last Vitals:  Vitals:   08/08/18 0803 08/08/18 1039  BP: (!) 146/67 (!) 157/62  Pulse: (!) 55   Resp: 17 18  Temp: 36.5 C 36.6 C  SpO2: 100% 97%    Last Pain:  Vitals:   08/08/18 1039  TempSrc: Oral  PainSc: 0-No pain                 Lavone Orn

## 2018-08-08 NOTE — Discharge Instructions (Signed)
Eye Surgery Discharge Instructions    Expect mild scratchy sensation or mild soreness. DO NOT RUB YOUR EYE!  The day of surgery:  Minimal physical activity, but bed rest is not required  No reading, computer work, or close hand work  No bending, lifting, or straining.  May watch TV  For 24 hours:  No driving, legal decisions, or alcoholic beverages  Safety precautions  Eat anything you prefer: It is better to start with liquids, then soup then solid foods.  _____ Eye patch should be worn until postoperative exam tomorrow.  ____ Solar shield eyeglasses should be worn for comfort in the sunlight/patch while sleeping  Resume all regular medications including aspirin or Coumadin if these were discontinued prior to surgery. You may shower, bathe, shave, or wash your hair. Tylenol may be taken for mild discomfort.  Call your doctor if you experience significant pain, nausea, or vomiting, fever > 101 or other signs of infection. [phone] or [phone] Specific instructions:  Follow-up Information    Marchia Meiers, MD Follow up on 08/09/2018.   Specialty:  Ophthalmology Why:  at 8:30am Contact information: [address] [place] [address] [phone]

## 2018-11-07 DIAGNOSIS — M5116 Intervertebral disc disorders with radiculopathy, lumbar region: Secondary | ICD-10-CM | POA: Diagnosis not present

## 2018-11-07 DIAGNOSIS — M4146 Neuromuscular scoliosis, lumbar region: Secondary | ICD-10-CM | POA: Diagnosis not present

## 2018-11-18 DIAGNOSIS — I83893 Varicose veins of bilateral lower extremities with other complications: Secondary | ICD-10-CM | POA: Diagnosis not present

## 2018-11-18 DIAGNOSIS — E782 Mixed hyperlipidemia: Secondary | ICD-10-CM | POA: Diagnosis not present

## 2018-11-18 DIAGNOSIS — I10 Essential (primary) hypertension: Secondary | ICD-10-CM | POA: Diagnosis not present

## 2018-11-18 DIAGNOSIS — R7989 Other specified abnormal findings of blood chemistry: Secondary | ICD-10-CM | POA: Diagnosis not present

## 2018-11-18 DIAGNOSIS — M4146 Neuromuscular scoliosis, lumbar region: Secondary | ICD-10-CM | POA: Diagnosis not present

## 2018-11-18 DIAGNOSIS — Z86010 Personal history of colonic polyps: Secondary | ICD-10-CM | POA: Diagnosis not present

## 2018-11-22 DIAGNOSIS — R600 Localized edema: Secondary | ICD-10-CM | POA: Diagnosis not present

## 2018-11-22 DIAGNOSIS — M5116 Intervertebral disc disorders with radiculopathy, lumbar region: Secondary | ICD-10-CM | POA: Diagnosis not present

## 2018-11-29 ENCOUNTER — Other Ambulatory Visit: Payer: Self-pay

## 2018-11-29 ENCOUNTER — Other Ambulatory Visit
Admission: RE | Admit: 2018-11-29 | Discharge: 2018-11-29 | Disposition: A | Discharge status: Home / Self Care | Payer: PPO | Source: Ambulatory Visit | Attending: Internal Medicine | Admitting: Internal Medicine

## 2018-11-29 DIAGNOSIS — Z1159 Encounter for screening for other viral diseases: Secondary | ICD-10-CM | POA: Diagnosis not present

## 2018-11-30 LAB — NOVEL CORONAVIRUS, NAA (HOSP ORDER, SEND-OUT TO REF LAB; TAT 18-24 HRS): SARS-CoV-2, NAA: NOT DETECTED

## 2018-12-03 ENCOUNTER — Encounter: Payer: Self-pay | Admitting: *Deleted

## 2018-12-04 ENCOUNTER — Ambulatory Visit: Payer: PPO | Admitting: Anesthesiology

## 2018-12-04 ENCOUNTER — Encounter: Payer: Self-pay | Admitting: *Deleted

## 2018-12-04 ENCOUNTER — Ambulatory Visit
Admission: RE | Admit: 2018-12-04 | Discharge: 2018-12-04 | Disposition: A | Discharge status: Home / Self Care | Payer: PPO | Attending: Internal Medicine | Admitting: Internal Medicine

## 2018-12-04 ENCOUNTER — Encounter
Admission: RE | Disposition: A | Discharge status: Home / Self Care | Payer: Self-pay | Source: Home / Self Care | Attending: Internal Medicine

## 2018-12-04 ENCOUNTER — Other Ambulatory Visit: Payer: Self-pay

## 2018-12-04 DIAGNOSIS — Z8719 Personal history of other diseases of the digestive system: Secondary | ICD-10-CM | POA: Insufficient documentation

## 2018-12-04 DIAGNOSIS — R609 Edema, unspecified: Secondary | ICD-10-CM | POA: Diagnosis not present

## 2018-12-04 DIAGNOSIS — I10 Essential (primary) hypertension: Secondary | ICD-10-CM | POA: Insufficient documentation

## 2018-12-04 DIAGNOSIS — Z7982 Long term (current) use of aspirin: Secondary | ICD-10-CM | POA: Insufficient documentation

## 2018-12-04 DIAGNOSIS — K640 First degree hemorrhoids: Secondary | ICD-10-CM | POA: Insufficient documentation

## 2018-12-04 DIAGNOSIS — M1990 Unspecified osteoarthritis, unspecified site: Secondary | ICD-10-CM | POA: Insufficient documentation

## 2018-12-04 DIAGNOSIS — Z79899 Other long term (current) drug therapy: Secondary | ICD-10-CM | POA: Insufficient documentation

## 2018-12-04 DIAGNOSIS — K5520 Angiodysplasia of colon without hemorrhage: Secondary | ICD-10-CM | POA: Diagnosis not present

## 2018-12-04 DIAGNOSIS — Z1211 Encounter for screening for malignant neoplasm of colon: Secondary | ICD-10-CM | POA: Insufficient documentation

## 2018-12-04 DIAGNOSIS — Z86010 Personal history of colonic polyps: Secondary | ICD-10-CM | POA: Diagnosis not present

## 2018-12-04 DIAGNOSIS — K6289 Other specified diseases of anus and rectum: Secondary | ICD-10-CM | POA: Diagnosis not present

## 2018-12-04 DIAGNOSIS — K5730 Diverticulosis of large intestine without perforation or abscess without bleeding: Secondary | ICD-10-CM | POA: Diagnosis not present

## 2018-12-04 HISTORY — PX: COLONOSCOPY WITH PROPOFOL: SHX5780

## 2018-12-04 HISTORY — DX: Anemia, unspecified: D64.9

## 2018-12-04 HISTORY — DX: Edema, unspecified: R60.9

## 2018-12-04 HISTORY — DX: Solitary cyst of unspecified breast: N60.09

## 2018-12-04 SURGERY — COLONOSCOPY WITH PROPOFOL
Anesthesia: General

## 2018-12-04 MED ORDER — PROPOFOL 10 MG/ML IV BOLUS
INTRAVENOUS | Status: AC
  Filled 2018-12-04: qty 20

## 2018-12-04 MED ORDER — SODIUM CHLORIDE 0.9 % IV SOLN
INTRAVENOUS | Status: DC
  Administered 2018-12-04: 09:00:00 via INTRAVENOUS

## 2018-12-04 MED ORDER — LIDOCAINE HCL (PF) 2 % IJ SOLN
INTRAMUSCULAR | Status: AC
  Filled 2018-12-04: qty 10

## 2018-12-04 MED ORDER — PROPOFOL 500 MG/50ML IV EMUL
INTRAVENOUS | Status: AC
  Filled 2018-12-04: qty 50

## 2018-12-04 MED ORDER — PROPOFOL 10 MG/ML IV BOLUS
INTRAVENOUS | Status: DC | PRN
  Administered 2018-12-04: 50 mg via INTRAVENOUS
  Administered 2018-12-04: 20 mg via INTRAVENOUS

## 2018-12-04 MED ORDER — PROPOFOL 500 MG/50ML IV EMUL
INTRAVENOUS | Status: DC | PRN
  Administered 2018-12-04: 160 ug/kg/min via INTRAVENOUS

## 2018-12-04 NOTE — Op Note (Signed)
[facility] Gastroenterology Patient Name: Brittany Lara Procedure Date: 12/04/2018 8:44 AM MRN: [id number] Account #: [id number] Date of Birth: 06-20-1940 Admit Type: Outpatient Age: 78 Room: [facility] ENDO ROOM 3 Gender: Female Note Status: Finalized Procedure:            Colonoscopy Indications:          High risk colon cancer surveillance: Personal history                        of colonic polyps Providers:            Benay Pike. [place] MD, MD Medicines:            Propofol per Anesthesia Complications:        No immediate complications. Procedure:            Pre-Anesthesia Assessment:                       - The risks and benefits of the procedure and the                        sedation options and risks were discussed with the                        patient. All questions were answered and informed                        consent was obtained.                       - Patient identification and proposed procedure were                        verified prior to the procedure by the nurse. The                        procedure was verified in the procedure room.                       - ASA Grade Assessment: III - A patient with severe                        systemic disease.                       - After reviewing the risks and benefits, the patient                        was deemed in satisfactory condition to undergo the                        procedure.                       After obtaining informed consent, the colonoscope was                        passed under direct vision. Throughout the procedure,                        the patient's blood pressure, pulse, and oxygen  saturations were monitored continuously. The                        Colonoscope was introduced through the anus and                        advanced to the the cecum, identified by appendiceal                        orifice and ileocecal valve. The colonoscopy was                 performed without difficulty. The patient tolerated the                        procedure well. The quality of the bowel preparation                        was good. The ileocecal valve, appendiceal orifice, and                        rectum were photographed. Findings:      The perianal and digital rectal examinations were normal. Pertinent       negatives include normal sphincter tone and no palpable rectal lesions.      Anal papilla(e) were hypertrophied.      Non-bleeding internal hemorrhoids were found during retroflexion. The       hemorrhoids were Grade I (internal hemorrhoids that do not prolapse).      Many small-mouthed diverticula were found in the left colon.      One medium-sized localized angioectasia without bleeding was found at       the ileocecal valve.      The exam was otherwise without abnormality. Impression:           - Anal papilla(e) were hypertrophied.                       - Non-bleeding internal hemorrhoids.                       - Diverticulosis in the left colon.                       - One non-bleeding colonic angioectasia.                       - The examination was otherwise normal.                       - No specimens collected. Recommendation:       - Patient has a contact number available for                        emergencies. The signs and symptoms of potential                        delayed complications were discussed with the patient.                        Return to normal activities tomorrow. Written discharge  instructions were provided to the patient.                       - Resume previous diet.                       - Continue present medications.                       - No repeat colonoscopy due to current age (53 years or                        older) and the absence of colonic polyps.                       - Return to GI office PRN.                       - The findings and recommendations were discussed  with                        the patient. Procedure Code(s):    --- Professional ---                       V4259, Colorectal cancer screening; colonoscopy on                        individual at high risk Diagnosis Code(s):    --- Professional ---                       K57.30, Diverticulosis of large intestine without                        perforation or abscess without bleeding                       K55.20, Angiodysplasia of colon without hemorrhage                       K64.0, First degree hemorrhoids                       K62.89, Other specified diseases of anus and rectum                       Z86.010, Personal history of colonic polyps CPT copyright 2019 American Medical Association. All rights reserved. The codes documented in this report are preliminary and upon coder review may  be revised to meet current compliance requirements. Efrain Sella MD, MD 12/04/2018 9:18:20 AM This report has been signed electronically. Number of Addenda: 0 Note Initiated On: 12/04/2018 8:44 AM Total Procedure Duration: 0 hours 12 minutes 57 seconds  Estimated Blood Loss: Estimated blood loss: none.      [facility]

## 2018-12-04 NOTE — Anesthesia Post-op Follow-up Note (Signed)
Anesthesia QCDR form completed.        

## 2018-12-04 NOTE — Anesthesia Postprocedure Evaluation (Signed)
Anesthesia Post Note  Patient: Brittany Lara  Procedure(s) Performed: COLONOSCOPY WITH PROPOFOL (N/A )  Patient location during evaluation: Endoscopy Anesthesia Type: General Level of consciousness: awake and alert Pain management: pain level controlled Vital Signs Assessment: post-procedure vital signs reviewed and stable Respiratory status: spontaneous breathing, nonlabored ventilation, respiratory function stable and patient connected to nasal cannula oxygen Cardiovascular status: blood pressure returned to baseline and stable Postop Assessment: no apparent nausea or vomiting Anesthetic complications: no     Last Vitals:  Vitals:   12/04/18 0930 12/04/18 0942  BP: (!) 129/91   Pulse: 63 (!) (P) 53  Resp: 17 (P) 18  Temp:    SpO2: 100% (P) 100%    Last Pain:  Vitals:   12/04/18 0802  TempSrc: Tympanic  PainSc: 0-No pain                 Precious Haws [place]

## 2018-12-04 NOTE — Interval H&P Note (Signed)
History and Physical Interval Note:  12/04/2018 8:18 AM  Brittany Lara  has presented today for surgery, with the diagnosis of PERSONAL HISTORY COLON POLYPS.  The various methods of treatment have been discussed with the patient and family. After consideration of risks, benefits and other options for treatment, the patient has consented to  Procedure(s): COLONOSCOPY WITH PROPOFOL (N/A) as a surgical intervention.  The patient's history has been reviewed, patient examined, no change in status, stable for surgery.  I have reviewed the patient's chart and labs.  Questions were answered to the patient's satisfaction.     [place], [place]

## 2018-12-04 NOTE — H&P (Signed)
Outpatient short stay form Pre-procedure 12/04/2018 8:17 AM Brittany Lara, M.D.  Primary Physician: Emily Filbert, M.D.  Reason for visit: Personal hx of colon polyps.  History of present illness:                           Patient presents for colonoscopy for a personal hx of colon polyps. The patient denies abdominal pain, abnormal weight loss or rectal bleeding.     Current Facility-Administered Medications:  .  0.9 %  sodium chloride infusion, , Intravenous, Continuous, Yigit Norkus, Benay Pike, MD  Medications Prior to Admission  Medication Sig Dispense Refill Last Dose  . cetirizine (ZYRTEC) 10 MG tablet Take 10 mg by mouth daily as needed for allergies.   12/03/2018 at Unknown time  . furosemide (LASIX) 20 MG tablet Take 20 mg by mouth daily.   12/03/2018 at Unknown time  . KLOR-CON M10 10 MEQ tablet Take 10 mEq by mouth daily.    12/03/2018 at Unknown time  . lisinopril (PRINIVIL,ZESTRIL) 20 MG tablet Take 20 mg by mouth daily.    12/04/2018 at Unknown time  . aspirin EC 81 MG tablet Take 81 mg by mouth daily.   11/27/2018  . Cholecalciferol (VITAMIN D3) 125 MCG (5000 UT) CAPS Take 5,000 Units by mouth every Sunday.    12/02/2018  . Cyanocobalamin (VITAMIN B-12) 5000 MCG TBDP Take 5,000 mcg by mouth every Tuesday.   11/27/2018  . etodolac (LODINE) 400 MG tablet Take 400 mg by mouth 2 (two) times daily.   Not Taking at Unknown time  . gabapentin (NEURONTIN) 300 MG capsule Take 300 mg by mouth 2 (two) times daily.   12/02/2018  . timolol (BETIMOL) 0.5 % ophthalmic solution 1 drop 2 (two) times daily.   Not Taking at Unknown time  . TOLAK 4 % CREA Apply 1 application topically daily.   Not Taking at Unknown time     No Known Allergies   Past Medical History:  Diagnosis Date  . Anemia   . Arthritis   . Breast cyst   . Cancer ([place])    skin  . Dependent edema   . Heart murmur   . Hypertension     Review of systems:  Otherwise negative.    Physical Exam  Gen: Alert, oriented.  Appears stated age.  HEENT: [place]/AT. PERRLA. Lungs: CTA, no wheezes. CV: RR nl S1, S2. Abd: soft, benign, no masses. BS+ Ext: No edema. Pulses 2+    Planned procedures: Proceed with colonoscopy. The patient understands the nature of the planned procedure, indications, risks, alternatives and potential complications including but not limited to bleeding, infection, perforation, damage to internal organs and possible oversedation/side effects from anesthesia. The patient agrees and gives consent to proceed.  Please refer to procedure notes for findings, recommendations and patient disposition/instructions.     Jabori Henegar K. Alice Lara, M.D. Gastroenterology 12/04/2018  8:17 AM

## 2018-12-04 NOTE — Transfer of Care (Signed)
Immediate Anesthesia Transfer of Care Note  Patient: Brittany Lara  Procedure(s) Performed: COLONOSCOPY WITH PROPOFOL (N/A )  Patient Location: PACU  Anesthesia Type:General  Level of Consciousness: drowsy  Airway & Oxygen Therapy: Patient Spontanous Breathing and Patient connected to nasal cannula oxygen  Post-op Assessment: Report given to RN and Post -op Vital signs reviewed and stable  Post vital signs: Reviewed and stable  Last Vitals:  Vitals Value Taken Time  BP 132/91 12/04/18 0919  Temp    Pulse 65 12/04/18 0919  Resp 11 12/04/18 0919  SpO2 100 % 12/04/18 0919  Vitals shown include unvalidated device data.  Last Pain:  Vitals:   12/04/18 0802  TempSrc: Tympanic  PainSc: 0-No pain         Complications: No apparent anesthesia complications

## 2018-12-04 NOTE — Anesthesia Preprocedure Evaluation (Signed)
Anesthesia Evaluation  Patient identified by MRN, date of birth, ID band Patient awake    Reviewed: Allergy & Precautions, H&P , NPO status , Patient's Chart, lab work & pertinent test results  History of Anesthesia Complications Negative for: history of anesthetic complications  Airway Mallampati: III  TM Distance: <3 FB Neck ROM: limited    Dental  (+) Poor Dentition, Upper Dentures, Lower Dentures   Pulmonary neg pulmonary ROS, neg shortness of breath,           Cardiovascular Exercise Tolerance: Good hypertension, (-) angina(-) Past MI and (-) DOE + Valvular Problems/Murmurs      Neuro/Psych negative neurological ROS  negative psych ROS   GI/Hepatic negative GI ROS, Neg liver ROS, neg GERD  ,  Endo/Other  negative endocrine ROS  Renal/GU negative Renal ROS  negative genitourinary   Musculoskeletal  (+) Arthritis ,   Abdominal   Peds  Hematology negative hematology ROS (+)   Anesthesia Other Findings Past Medical History: No date: Anemia No date: Arthritis No date: Breast cyst No date: Cancer ([facility])     Comment:  skin No date: Dependent edema No date: Heart murmur No date: Hypertension  Past Surgical History: No date: ABDOMINAL HYSTERECTOMY No date: ANAL FISSURE REPAIR yrs ago: BREAST EXCISIONAL BIOPSY; Bilateral     Comment:  benign No date: BREAST SURGERY; Bilateral     Comment:  bilateral breast biopsy (benign) 07/11/2018: CATARACT EXTRACTION W/PHACO; Right     Comment:  Procedure: CATARACT EXTRACTION PHACO AND INTRAOCULAR               LENS PLACEMENT ([place]) RIGHT EYE;  Surgeon: Marchia Meiers,               MD;  Location: [facility] ORS;  Service: Ophthalmology;                Laterality: Right;  [place] 01:13.7 CDE 9.93 Fluid pack Lot               # [id number] 08/08/2018: CATARACT EXTRACTION W/PHACO; Left     Comment:  Procedure: CATARACT EXTRACTION PHACO AND INTRAOCULAR               LENS PLACEMENT ([place])  LEFT;  Surgeon: Marchia Meiers, MD;                Location: [facility] ORS;  Service: Ophthalmology;  Laterality:              Left;  [place] 01:29.2CDE 11.56Fluid Pack Lot # [id number] No date: CHOLECYSTECTOMY No date: COLONOSCOPY No date: EYE SURGERY     Comment:  cataract extraction No date: JOINT REPLACEMENT; Bilateral     Comment:  TKR 07/2001: left total knee replacement  BMI    Body Mass Index: 39.27 kg/m      Reproductive/Obstetrics negative OB ROS                             Anesthesia Physical Anesthesia Plan  ASA: III  Anesthesia Plan: General   Post-op Pain Management:    Induction: Intravenous  PONV Risk Score and Plan: Propofol infusion and TIVA  Airway Management Planned: Natural Airway and Nasal Cannula  Additional Equipment:   Intra-op Plan:   Post-operative Plan:   Informed Consent: I have reviewed the patients History and Physical, chart, labs and discussed the procedure including the risks, benefits and alternatives for the proposed anesthesia with the patient or authorized representative  who has indicated his/her understanding and acceptance.     Dental Advisory Given  Plan Discussed with: Anesthesiologist, CRNA and Surgeon  Anesthesia Plan Comments: (Patient consented for risks of anesthesia including but not limited to:  - adverse reactions to medications - risk of intubation if required - damage to teeth, lips or other oral mucosa - sore throat or hoarseness - Damage to heart, brain, lungs or loss of life  Patient voiced understanding.)        Anesthesia Quick Evaluation

## 2018-12-05 ENCOUNTER — Encounter: Payer: Self-pay | Admitting: Internal Medicine

## 2019-01-06 ENCOUNTER — Other Ambulatory Visit: Payer: Self-pay | Admitting: Internal Medicine

## 2019-01-06 DIAGNOSIS — Z1231 Encounter for screening mammogram for malignant neoplasm of breast: Secondary | ICD-10-CM

## 2019-01-20 ENCOUNTER — Ambulatory Visit
Admission: RE | Admit: 2019-01-20 | Discharge: 2019-01-20 | Disposition: A | Discharge status: Home / Self Care | Payer: PPO | Source: Ambulatory Visit | Attending: Internal Medicine | Admitting: Internal Medicine

## 2019-01-20 ENCOUNTER — Other Ambulatory Visit: Payer: Self-pay

## 2019-01-20 DIAGNOSIS — Z1231 Encounter for screening mammogram for malignant neoplasm of breast: Secondary | ICD-10-CM | POA: Diagnosis not present

## 2019-03-11 DIAGNOSIS — Z859 Personal history of malignant neoplasm, unspecified: Secondary | ICD-10-CM | POA: Diagnosis not present

## 2019-03-11 DIAGNOSIS — H26493 Other secondary cataract, bilateral: Secondary | ICD-10-CM | POA: Diagnosis not present

## 2019-03-11 DIAGNOSIS — Z86018 Personal history of other benign neoplasm: Secondary | ICD-10-CM | POA: Diagnosis not present

## 2019-03-11 DIAGNOSIS — Z85828 Personal history of other malignant neoplasm of skin: Secondary | ICD-10-CM | POA: Diagnosis not present

## 2019-03-11 DIAGNOSIS — L578 Other skin changes due to chronic exposure to nonionizing radiation: Secondary | ICD-10-CM | POA: Diagnosis not present

## 2019-03-11 DIAGNOSIS — L570 Actinic keratosis: Secondary | ICD-10-CM | POA: Diagnosis not present

## 2019-03-11 DIAGNOSIS — L918 Other hypertrophic disorders of the skin: Secondary | ICD-10-CM | POA: Diagnosis not present

## 2019-04-22 DIAGNOSIS — Z03818 Encounter for observation for suspected exposure to other biological agents ruled out: Secondary | ICD-10-CM | POA: Diagnosis not present

## 2019-04-29 DIAGNOSIS — I10 Essential (primary) hypertension: Secondary | ICD-10-CM | POA: Diagnosis not present

## 2019-04-29 DIAGNOSIS — M5116 Intervertebral disc disorders with radiculopathy, lumbar region: Secondary | ICD-10-CM | POA: Diagnosis not present

## 2019-05-13 DIAGNOSIS — M5116 Intervertebral disc disorders with radiculopathy, lumbar region: Secondary | ICD-10-CM | POA: Diagnosis not present

## 2019-05-23 DIAGNOSIS — R490 Dysphonia: Secondary | ICD-10-CM | POA: Diagnosis not present

## 2019-05-23 DIAGNOSIS — R682 Dry mouth, unspecified: Secondary | ICD-10-CM | POA: Diagnosis not present

## 2019-05-23 DIAGNOSIS — K219 Gastro-esophageal reflux disease without esophagitis: Secondary | ICD-10-CM | POA: Diagnosis not present

## 2019-07-04 DIAGNOSIS — K219 Gastro-esophageal reflux disease without esophagitis: Secondary | ICD-10-CM | POA: Diagnosis not present

## 2019-07-04 DIAGNOSIS — J301 Allergic rhinitis due to pollen: Secondary | ICD-10-CM | POA: Diagnosis not present

## 2019-07-04 DIAGNOSIS — R490 Dysphonia: Secondary | ICD-10-CM | POA: Diagnosis not present

## 2019-07-15 DIAGNOSIS — E538 Deficiency of other specified B group vitamins: Secondary | ICD-10-CM | POA: Diagnosis not present

## 2019-07-15 DIAGNOSIS — E782 Mixed hyperlipidemia: Secondary | ICD-10-CM | POA: Diagnosis not present

## 2019-07-22 DIAGNOSIS — Z0000 Encounter for general adult medical examination without abnormal findings: Secondary | ICD-10-CM | POA: Diagnosis not present

## 2019-07-22 DIAGNOSIS — E782 Mixed hyperlipidemia: Secondary | ICD-10-CM | POA: Diagnosis not present

## 2019-07-22 DIAGNOSIS — M5116 Intervertebral disc disorders with radiculopathy, lumbar region: Secondary | ICD-10-CM | POA: Diagnosis not present

## 2019-07-22 DIAGNOSIS — E538 Deficiency of other specified B group vitamins: Secondary | ICD-10-CM | POA: Diagnosis not present

## 2019-08-19 ENCOUNTER — Other Ambulatory Visit: Payer: Self-pay | Admitting: Internal Medicine

## 2019-08-19 DIAGNOSIS — M5116 Intervertebral disc disorders with radiculopathy, lumbar region: Secondary | ICD-10-CM | POA: Diagnosis not present

## 2019-08-19 DIAGNOSIS — I10 Essential (primary) hypertension: Secondary | ICD-10-CM | POA: Diagnosis not present

## 2019-08-19 DIAGNOSIS — G8929 Other chronic pain: Secondary | ICD-10-CM

## 2019-08-19 DIAGNOSIS — M5440 Lumbago with sciatica, unspecified side: Secondary | ICD-10-CM | POA: Diagnosis not present

## 2019-09-01 ENCOUNTER — Ambulatory Visit
Admission: RE | Admit: 2019-09-01 | Discharge: 2019-09-01 | Disposition: A | Discharge status: Home / Self Care | Payer: PPO | Source: Ambulatory Visit | Attending: Internal Medicine | Admitting: Internal Medicine

## 2019-09-01 ENCOUNTER — Other Ambulatory Visit: Payer: Self-pay

## 2019-09-01 DIAGNOSIS — M5440 Lumbago with sciatica, unspecified side: Secondary | ICD-10-CM | POA: Insufficient documentation

## 2019-09-01 DIAGNOSIS — M545 Low back pain: Secondary | ICD-10-CM | POA: Diagnosis not present

## 2019-09-01 DIAGNOSIS — G8929 Other chronic pain: Secondary | ICD-10-CM | POA: Insufficient documentation

## 2019-09-04 DIAGNOSIS — M545 Low back pain: Secondary | ICD-10-CM | POA: Diagnosis not present

## 2019-09-04 DIAGNOSIS — G8929 Other chronic pain: Secondary | ICD-10-CM | POA: Diagnosis not present

## 2019-09-04 DIAGNOSIS — M419 Scoliosis, unspecified: Secondary | ICD-10-CM | POA: Diagnosis not present

## 2019-09-09 DIAGNOSIS — H26493 Other secondary cataract, bilateral: Secondary | ICD-10-CM | POA: Diagnosis not present

## 2019-09-10 DIAGNOSIS — G8929 Other chronic pain: Secondary | ICD-10-CM | POA: Diagnosis not present

## 2019-09-10 DIAGNOSIS — M48061 Spinal stenosis, lumbar region without neurogenic claudication: Secondary | ICD-10-CM | POA: Diagnosis not present

## 2019-09-10 DIAGNOSIS — M5441 Lumbago with sciatica, right side: Secondary | ICD-10-CM | POA: Diagnosis not present

## 2019-09-10 DIAGNOSIS — M5442 Lumbago with sciatica, left side: Secondary | ICD-10-CM | POA: Diagnosis not present

## 2019-09-24 DIAGNOSIS — M5441 Lumbago with sciatica, right side: Secondary | ICD-10-CM | POA: Diagnosis not present

## 2019-09-24 DIAGNOSIS — M5442 Lumbago with sciatica, left side: Secondary | ICD-10-CM | POA: Diagnosis not present

## 2019-09-24 DIAGNOSIS — G8929 Other chronic pain: Secondary | ICD-10-CM | POA: Diagnosis not present

## 2019-10-07 DIAGNOSIS — M5441 Lumbago with sciatica, right side: Secondary | ICD-10-CM | POA: Diagnosis not present

## 2019-10-07 DIAGNOSIS — H26491 Other secondary cataract, right eye: Secondary | ICD-10-CM | POA: Diagnosis not present

## 2019-10-07 DIAGNOSIS — M48061 Spinal stenosis, lumbar region without neurogenic claudication: Secondary | ICD-10-CM | POA: Diagnosis not present

## 2019-10-07 DIAGNOSIS — G8929 Other chronic pain: Secondary | ICD-10-CM | POA: Diagnosis not present

## 2019-10-07 DIAGNOSIS — M5442 Lumbago with sciatica, left side: Secondary | ICD-10-CM | POA: Diagnosis not present

## 2019-10-15 DIAGNOSIS — M5442 Lumbago with sciatica, left side: Secondary | ICD-10-CM | POA: Diagnosis not present

## 2019-10-15 DIAGNOSIS — M5441 Lumbago with sciatica, right side: Secondary | ICD-10-CM | POA: Diagnosis not present

## 2019-10-15 DIAGNOSIS — G8929 Other chronic pain: Secondary | ICD-10-CM | POA: Diagnosis not present

## 2019-10-28 DIAGNOSIS — M48061 Spinal stenosis, lumbar region without neurogenic claudication: Secondary | ICD-10-CM | POA: Diagnosis not present

## 2019-10-28 DIAGNOSIS — M5442 Lumbago with sciatica, left side: Secondary | ICD-10-CM | POA: Diagnosis not present

## 2019-10-28 DIAGNOSIS — M5441 Lumbago with sciatica, right side: Secondary | ICD-10-CM | POA: Diagnosis not present

## 2019-10-28 DIAGNOSIS — G8929 Other chronic pain: Secondary | ICD-10-CM | POA: Diagnosis not present

## 2019-11-06 DIAGNOSIS — N3090 Cystitis, unspecified without hematuria: Secondary | ICD-10-CM | POA: Diagnosis not present

## 2019-11-18 DIAGNOSIS — G8929 Other chronic pain: Secondary | ICD-10-CM | POA: Diagnosis not present

## 2019-11-18 DIAGNOSIS — M5116 Intervertebral disc disorders with radiculopathy, lumbar region: Secondary | ICD-10-CM | POA: Diagnosis not present

## 2019-11-18 DIAGNOSIS — M48061 Spinal stenosis, lumbar region without neurogenic claudication: Secondary | ICD-10-CM | POA: Diagnosis not present

## 2019-11-18 DIAGNOSIS — M5441 Lumbago with sciatica, right side: Secondary | ICD-10-CM | POA: Diagnosis not present

## 2019-11-18 DIAGNOSIS — M5442 Lumbago with sciatica, left side: Secondary | ICD-10-CM | POA: Diagnosis not present

## 2019-12-02 DIAGNOSIS — M5442 Lumbago with sciatica, left side: Secondary | ICD-10-CM | POA: Diagnosis not present

## 2019-12-02 DIAGNOSIS — M48061 Spinal stenosis, lumbar region without neurogenic claudication: Secondary | ICD-10-CM | POA: Diagnosis not present

## 2019-12-02 DIAGNOSIS — G8929 Other chronic pain: Secondary | ICD-10-CM | POA: Diagnosis not present

## 2019-12-02 DIAGNOSIS — M5441 Lumbago with sciatica, right side: Secondary | ICD-10-CM | POA: Diagnosis not present

## 2019-12-22 ENCOUNTER — Other Ambulatory Visit: Payer: Self-pay | Admitting: Internal Medicine

## 2019-12-22 DIAGNOSIS — Z1231 Encounter for screening mammogram for malignant neoplasm of breast: Secondary | ICD-10-CM

## 2020-01-27 ENCOUNTER — Ambulatory Visit
Admission: RE | Admit: 2020-01-27 | Discharge: 2020-01-27 | Disposition: A | Discharge status: Home / Self Care | Payer: PPO | Source: Ambulatory Visit | Attending: Internal Medicine | Admitting: Internal Medicine

## 2020-01-27 ENCOUNTER — Other Ambulatory Visit: Payer: Self-pay

## 2020-01-27 DIAGNOSIS — Z1231 Encounter for screening mammogram for malignant neoplasm of breast: Secondary | ICD-10-CM | POA: Diagnosis not present

## 2020-02-24 DIAGNOSIS — M5442 Lumbago with sciatica, left side: Secondary | ICD-10-CM | POA: Diagnosis not present

## 2020-02-24 DIAGNOSIS — M48061 Spinal stenosis, lumbar region without neurogenic claudication: Secondary | ICD-10-CM | POA: Diagnosis not present

## 2020-02-24 DIAGNOSIS — M5441 Lumbago with sciatica, right side: Secondary | ICD-10-CM | POA: Diagnosis not present

## 2020-02-24 DIAGNOSIS — G8929 Other chronic pain: Secondary | ICD-10-CM | POA: Diagnosis not present

## 2020-02-27 DIAGNOSIS — M5442 Lumbago with sciatica, left side: Secondary | ICD-10-CM | POA: Diagnosis not present

## 2020-03-10 DIAGNOSIS — G8929 Other chronic pain: Secondary | ICD-10-CM | POA: Diagnosis not present

## 2020-03-10 DIAGNOSIS — M48061 Spinal stenosis, lumbar region without neurogenic claudication: Secondary | ICD-10-CM | POA: Diagnosis not present

## 2020-03-10 DIAGNOSIS — M5442 Lumbago with sciatica, left side: Secondary | ICD-10-CM | POA: Diagnosis not present

## 2020-03-10 DIAGNOSIS — M5441 Lumbago with sciatica, right side: Secondary | ICD-10-CM | POA: Diagnosis not present

## 2020-03-12 DIAGNOSIS — M5441 Lumbago with sciatica, right side: Secondary | ICD-10-CM | POA: Diagnosis not present

## 2020-03-12 DIAGNOSIS — M5442 Lumbago with sciatica, left side: Secondary | ICD-10-CM | POA: Diagnosis not present

## 2020-03-15 DIAGNOSIS — D485 Neoplasm of uncertain behavior of skin: Secondary | ICD-10-CM | POA: Diagnosis not present

## 2020-03-15 DIAGNOSIS — Z859 Personal history of malignant neoplasm, unspecified: Secondary | ICD-10-CM | POA: Diagnosis not present

## 2020-03-15 DIAGNOSIS — Z85828 Personal history of other malignant neoplasm of skin: Secondary | ICD-10-CM | POA: Diagnosis not present

## 2020-03-15 DIAGNOSIS — L578 Other skin changes due to chronic exposure to nonionizing radiation: Secondary | ICD-10-CM | POA: Diagnosis not present

## 2020-03-15 DIAGNOSIS — Z872 Personal history of diseases of the skin and subcutaneous tissue: Secondary | ICD-10-CM | POA: Diagnosis not present

## 2020-03-15 DIAGNOSIS — L570 Actinic keratosis: Secondary | ICD-10-CM | POA: Diagnosis not present

## 2020-03-15 DIAGNOSIS — L821 Other seborrheic keratosis: Secondary | ICD-10-CM | POA: Diagnosis not present

## 2020-03-15 DIAGNOSIS — Z86018 Personal history of other benign neoplasm: Secondary | ICD-10-CM | POA: Diagnosis not present

## 2020-03-15 DIAGNOSIS — C44319 Basal cell carcinoma of skin of other parts of face: Secondary | ICD-10-CM | POA: Diagnosis not present

## 2020-03-24 DIAGNOSIS — G8929 Other chronic pain: Secondary | ICD-10-CM | POA: Diagnosis not present

## 2020-03-24 DIAGNOSIS — M5441 Lumbago with sciatica, right side: Secondary | ICD-10-CM | POA: Diagnosis not present

## 2020-03-24 DIAGNOSIS — M5442 Lumbago with sciatica, left side: Secondary | ICD-10-CM | POA: Diagnosis not present

## 2020-03-24 DIAGNOSIS — M48061 Spinal stenosis, lumbar region without neurogenic claudication: Secondary | ICD-10-CM | POA: Diagnosis not present

## 2020-03-30 DIAGNOSIS — C4491 Basal cell carcinoma of skin, unspecified: Secondary | ICD-10-CM | POA: Diagnosis not present

## 2020-03-30 DIAGNOSIS — C44319 Basal cell carcinoma of skin of other parts of face: Secondary | ICD-10-CM | POA: Diagnosis not present

## 2020-04-20 DIAGNOSIS — C44319 Basal cell carcinoma of skin of other parts of face: Secondary | ICD-10-CM | POA: Diagnosis not present

## 2020-04-23 DIAGNOSIS — M7582 Other shoulder lesions, left shoulder: Secondary | ICD-10-CM | POA: Diagnosis not present

## 2020-04-23 DIAGNOSIS — I10 Essential (primary) hypertension: Secondary | ICD-10-CM | POA: Diagnosis not present

## 2020-04-23 DIAGNOSIS — M5442 Lumbago with sciatica, left side: Secondary | ICD-10-CM | POA: Diagnosis not present

## 2020-04-23 DIAGNOSIS — G8929 Other chronic pain: Secondary | ICD-10-CM | POA: Diagnosis not present

## 2020-04-23 DIAGNOSIS — M5441 Lumbago with sciatica, right side: Secondary | ICD-10-CM | POA: Diagnosis not present

## 2020-04-26 DIAGNOSIS — M5442 Lumbago with sciatica, left side: Secondary | ICD-10-CM | POA: Diagnosis not present

## 2020-04-26 DIAGNOSIS — M5441 Lumbago with sciatica, right side: Secondary | ICD-10-CM | POA: Diagnosis not present

## 2020-04-26 DIAGNOSIS — M48061 Spinal stenosis, lumbar region without neurogenic claudication: Secondary | ICD-10-CM | POA: Diagnosis not present

## 2020-05-03 DIAGNOSIS — M5010 Cervical disc disorder with radiculopathy, unspecified cervical region: Secondary | ICD-10-CM | POA: Diagnosis not present

## 2020-05-04 DIAGNOSIS — C44319 Basal cell carcinoma of skin of other parts of face: Secondary | ICD-10-CM | POA: Diagnosis not present

## 2020-05-10 DIAGNOSIS — M5441 Lumbago with sciatica, right side: Secondary | ICD-10-CM | POA: Diagnosis not present

## 2020-05-10 DIAGNOSIS — G8929 Other chronic pain: Secondary | ICD-10-CM | POA: Diagnosis not present

## 2020-05-10 DIAGNOSIS — M48061 Spinal stenosis, lumbar region without neurogenic claudication: Secondary | ICD-10-CM | POA: Diagnosis not present

## 2020-05-10 DIAGNOSIS — M5442 Lumbago with sciatica, left side: Secondary | ICD-10-CM | POA: Diagnosis not present

## 2020-05-18 DIAGNOSIS — M25512 Pain in left shoulder: Secondary | ICD-10-CM | POA: Diagnosis not present

## 2020-05-18 DIAGNOSIS — M7542 Impingement syndrome of left shoulder: Secondary | ICD-10-CM | POA: Diagnosis not present

## 2020-05-27 DIAGNOSIS — G8929 Other chronic pain: Secondary | ICD-10-CM | POA: Diagnosis not present

## 2020-05-27 DIAGNOSIS — J45902 Unspecified asthma with status asthmaticus: Secondary | ICD-10-CM | POA: Diagnosis not present

## 2020-05-27 DIAGNOSIS — M5442 Lumbago with sciatica, left side: Secondary | ICD-10-CM | POA: Diagnosis not present

## 2020-05-27 DIAGNOSIS — M5441 Lumbago with sciatica, right side: Secondary | ICD-10-CM | POA: Diagnosis not present

## 2020-05-27 DIAGNOSIS — H8110 Benign paroxysmal vertigo, unspecified ear: Secondary | ICD-10-CM | POA: Diagnosis not present

## 2020-07-16 DIAGNOSIS — E782 Mixed hyperlipidemia: Secondary | ICD-10-CM | POA: Diagnosis not present

## 2020-07-16 DIAGNOSIS — E538 Deficiency of other specified B group vitamins: Secondary | ICD-10-CM | POA: Diagnosis not present

## 2020-07-23 DIAGNOSIS — G8929 Other chronic pain: Secondary | ICD-10-CM | POA: Diagnosis not present

## 2020-07-23 DIAGNOSIS — Z0000 Encounter for general adult medical examination without abnormal findings: Secondary | ICD-10-CM | POA: Diagnosis not present

## 2020-07-23 DIAGNOSIS — E782 Mixed hyperlipidemia: Secondary | ICD-10-CM | POA: Diagnosis not present

## 2020-07-23 DIAGNOSIS — E538 Deficiency of other specified B group vitamins: Secondary | ICD-10-CM | POA: Diagnosis not present

## 2020-07-23 DIAGNOSIS — M5442 Lumbago with sciatica, left side: Secondary | ICD-10-CM | POA: Diagnosis not present

## 2020-07-23 DIAGNOSIS — M5441 Lumbago with sciatica, right side: Secondary | ICD-10-CM | POA: Diagnosis not present

## 2020-09-28 DIAGNOSIS — Z85828 Personal history of other malignant neoplasm of skin: Secondary | ICD-10-CM | POA: Diagnosis not present

## 2020-09-28 DIAGNOSIS — L821 Other seborrheic keratosis: Secondary | ICD-10-CM | POA: Diagnosis not present

## 2020-10-01 DIAGNOSIS — M48061 Spinal stenosis, lumbar region without neurogenic claudication: Secondary | ICD-10-CM | POA: Diagnosis not present

## 2020-10-01 DIAGNOSIS — G8929 Other chronic pain: Secondary | ICD-10-CM | POA: Diagnosis not present

## 2020-10-01 DIAGNOSIS — M5442 Lumbago with sciatica, left side: Secondary | ICD-10-CM | POA: Diagnosis not present

## 2020-10-01 DIAGNOSIS — M5441 Lumbago with sciatica, right side: Secondary | ICD-10-CM | POA: Diagnosis not present

## 2020-10-04 DIAGNOSIS — M5441 Lumbago with sciatica, right side: Secondary | ICD-10-CM | POA: Diagnosis not present

## 2020-10-04 DIAGNOSIS — M5442 Lumbago with sciatica, left side: Secondary | ICD-10-CM | POA: Diagnosis not present

## 2020-10-04 DIAGNOSIS — M48061 Spinal stenosis, lumbar region without neurogenic claudication: Secondary | ICD-10-CM | POA: Diagnosis not present

## 2020-10-04 DIAGNOSIS — G8929 Other chronic pain: Secondary | ICD-10-CM | POA: Diagnosis not present

## 2020-10-19 DIAGNOSIS — G8929 Other chronic pain: Secondary | ICD-10-CM | POA: Diagnosis not present

## 2020-10-19 DIAGNOSIS — M533 Sacrococcygeal disorders, not elsewhere classified: Secondary | ICD-10-CM | POA: Diagnosis not present

## 2020-10-19 DIAGNOSIS — M5441 Lumbago with sciatica, right side: Secondary | ICD-10-CM | POA: Diagnosis not present

## 2020-10-19 DIAGNOSIS — M5442 Lumbago with sciatica, left side: Secondary | ICD-10-CM | POA: Diagnosis not present

## 2020-10-19 DIAGNOSIS — M48061 Spinal stenosis, lumbar region without neurogenic claudication: Secondary | ICD-10-CM | POA: Diagnosis not present

## 2020-10-25 DIAGNOSIS — M533 Sacrococcygeal disorders, not elsewhere classified: Secondary | ICD-10-CM | POA: Diagnosis not present

## 2020-11-09 DIAGNOSIS — G8929 Other chronic pain: Secondary | ICD-10-CM | POA: Diagnosis not present

## 2020-11-09 DIAGNOSIS — M5442 Lumbago with sciatica, left side: Secondary | ICD-10-CM | POA: Diagnosis not present

## 2020-11-09 DIAGNOSIS — M533 Sacrococcygeal disorders, not elsewhere classified: Secondary | ICD-10-CM | POA: Diagnosis not present

## 2020-11-09 DIAGNOSIS — M5441 Lumbago with sciatica, right side: Secondary | ICD-10-CM | POA: Diagnosis not present

## 2020-11-09 DIAGNOSIS — M48061 Spinal stenosis, lumbar region without neurogenic claudication: Secondary | ICD-10-CM | POA: Diagnosis not present

## 2020-11-29 DIAGNOSIS — M533 Sacrococcygeal disorders, not elsewhere classified: Secondary | ICD-10-CM | POA: Diagnosis not present

## 2020-12-05 DIAGNOSIS — M7061 Trochanteric bursitis, right hip: Secondary | ICD-10-CM | POA: Diagnosis not present

## 2020-12-10 DIAGNOSIS — M47816 Spondylosis without myelopathy or radiculopathy, lumbar region: Secondary | ICD-10-CM | POA: Diagnosis not present

## 2020-12-10 DIAGNOSIS — M48062 Spinal stenosis, lumbar region with neurogenic claudication: Secondary | ICD-10-CM | POA: Diagnosis not present

## 2020-12-10 DIAGNOSIS — M533 Sacrococcygeal disorders, not elsewhere classified: Secondary | ICD-10-CM | POA: Diagnosis not present

## 2020-12-10 DIAGNOSIS — M5442 Lumbago with sciatica, left side: Secondary | ICD-10-CM | POA: Diagnosis not present

## 2020-12-10 DIAGNOSIS — M5441 Lumbago with sciatica, right side: Secondary | ICD-10-CM | POA: Diagnosis not present

## 2020-12-10 DIAGNOSIS — G8929 Other chronic pain: Secondary | ICD-10-CM | POA: Diagnosis not present

## 2020-12-17 DIAGNOSIS — M47816 Spondylosis without myelopathy or radiculopathy, lumbar region: Secondary | ICD-10-CM | POA: Diagnosis not present

## 2020-12-24 ENCOUNTER — Other Ambulatory Visit: Payer: Self-pay | Admitting: Internal Medicine

## 2020-12-24 DIAGNOSIS — Z1231 Encounter for screening mammogram for malignant neoplasm of breast: Secondary | ICD-10-CM

## 2020-12-27 DIAGNOSIS — M5442 Lumbago with sciatica, left side: Secondary | ICD-10-CM | POA: Diagnosis not present

## 2020-12-27 DIAGNOSIS — M533 Sacrococcygeal disorders, not elsewhere classified: Secondary | ICD-10-CM | POA: Diagnosis not present

## 2020-12-27 DIAGNOSIS — M48062 Spinal stenosis, lumbar region with neurogenic claudication: Secondary | ICD-10-CM | POA: Diagnosis not present

## 2020-12-27 DIAGNOSIS — G8929 Other chronic pain: Secondary | ICD-10-CM | POA: Diagnosis not present

## 2020-12-27 DIAGNOSIS — M5441 Lumbago with sciatica, right side: Secondary | ICD-10-CM | POA: Diagnosis not present

## 2020-12-27 DIAGNOSIS — M47816 Spondylosis without myelopathy or radiculopathy, lumbar region: Secondary | ICD-10-CM | POA: Diagnosis not present

## 2020-12-31 DIAGNOSIS — M47816 Spondylosis without myelopathy or radiculopathy, lumbar region: Secondary | ICD-10-CM | POA: Diagnosis not present

## 2021-01-07 DIAGNOSIS — M533 Sacrococcygeal disorders, not elsewhere classified: Secondary | ICD-10-CM | POA: Diagnosis not present

## 2021-01-07 DIAGNOSIS — M47816 Spondylosis without myelopathy or radiculopathy, lumbar region: Secondary | ICD-10-CM | POA: Diagnosis not present

## 2021-01-07 DIAGNOSIS — M5442 Lumbago with sciatica, left side: Secondary | ICD-10-CM | POA: Diagnosis not present

## 2021-01-07 DIAGNOSIS — M5441 Lumbago with sciatica, right side: Secondary | ICD-10-CM | POA: Diagnosis not present

## 2021-01-07 DIAGNOSIS — G8929 Other chronic pain: Secondary | ICD-10-CM | POA: Diagnosis not present

## 2021-01-07 DIAGNOSIS — M48062 Spinal stenosis, lumbar region with neurogenic claudication: Secondary | ICD-10-CM | POA: Diagnosis not present

## 2021-01-12 ENCOUNTER — Other Ambulatory Visit: Payer: Self-pay | Admitting: Family Medicine

## 2021-01-12 ENCOUNTER — Other Ambulatory Visit (HOSPITAL_COMMUNITY): Payer: Self-pay | Admitting: Family Medicine

## 2021-01-12 DIAGNOSIS — M48061 Spinal stenosis, lumbar region without neurogenic claudication: Secondary | ICD-10-CM | POA: Diagnosis not present

## 2021-01-12 DIAGNOSIS — R519 Headache, unspecified: Secondary | ICD-10-CM | POA: Diagnosis not present

## 2021-01-12 DIAGNOSIS — G8929 Other chronic pain: Secondary | ICD-10-CM

## 2021-01-12 DIAGNOSIS — I10 Essential (primary) hypertension: Secondary | ICD-10-CM | POA: Diagnosis not present

## 2021-01-12 DIAGNOSIS — M5136 Other intervertebral disc degeneration, lumbar region: Secondary | ICD-10-CM | POA: Diagnosis not present

## 2021-01-12 DIAGNOSIS — M5441 Lumbago with sciatica, right side: Secondary | ICD-10-CM | POA: Diagnosis not present

## 2021-01-12 DIAGNOSIS — M5442 Lumbago with sciatica, left side: Secondary | ICD-10-CM | POA: Diagnosis not present

## 2021-01-24 ENCOUNTER — Other Ambulatory Visit: Payer: Self-pay

## 2021-01-24 ENCOUNTER — Ambulatory Visit
Admission: RE | Admit: 2021-01-24 | Discharge: 2021-01-24 | Disposition: A | Discharge status: Home / Self Care | Payer: PPO | Source: Ambulatory Visit | Attending: Family Medicine | Admitting: Family Medicine

## 2021-01-24 DIAGNOSIS — M5441 Lumbago with sciatica, right side: Secondary | ICD-10-CM | POA: Diagnosis not present

## 2021-01-24 DIAGNOSIS — G8929 Other chronic pain: Secondary | ICD-10-CM | POA: Insufficient documentation

## 2021-01-24 DIAGNOSIS — M545 Low back pain, unspecified: Secondary | ICD-10-CM | POA: Diagnosis not present

## 2021-01-24 DIAGNOSIS — M5442 Lumbago with sciatica, left side: Secondary | ICD-10-CM | POA: Diagnosis not present

## 2021-01-26 DIAGNOSIS — M47816 Spondylosis without myelopathy or radiculopathy, lumbar region: Secondary | ICD-10-CM | POA: Diagnosis not present

## 2021-01-28 DIAGNOSIS — M5442 Lumbago with sciatica, left side: Secondary | ICD-10-CM | POA: Diagnosis not present

## 2021-01-28 DIAGNOSIS — E669 Obesity, unspecified: Secondary | ICD-10-CM | POA: Diagnosis not present

## 2021-01-28 DIAGNOSIS — I10 Essential (primary) hypertension: Secondary | ICD-10-CM | POA: Diagnosis not present

## 2021-01-28 DIAGNOSIS — M5441 Lumbago with sciatica, right side: Secondary | ICD-10-CM | POA: Diagnosis not present

## 2021-01-28 DIAGNOSIS — E782 Mixed hyperlipidemia: Secondary | ICD-10-CM | POA: Diagnosis not present

## 2021-01-28 DIAGNOSIS — G8929 Other chronic pain: Secondary | ICD-10-CM | POA: Diagnosis not present

## 2021-02-01 ENCOUNTER — Other Ambulatory Visit: Payer: Self-pay

## 2021-02-01 ENCOUNTER — Ambulatory Visit
Admission: RE | Admit: 2021-02-01 | Discharge: 2021-02-01 | Disposition: A | Discharge status: Home / Self Care | Payer: PPO | Source: Ambulatory Visit | Attending: Internal Medicine | Admitting: Internal Medicine

## 2021-02-01 DIAGNOSIS — Z1231 Encounter for screening mammogram for malignant neoplasm of breast: Secondary | ICD-10-CM | POA: Diagnosis not present

## 2021-02-15 DIAGNOSIS — M48062 Spinal stenosis, lumbar region with neurogenic claudication: Secondary | ICD-10-CM | POA: Diagnosis not present

## 2021-02-15 DIAGNOSIS — G8929 Other chronic pain: Secondary | ICD-10-CM | POA: Diagnosis not present

## 2021-02-15 DIAGNOSIS — M5442 Lumbago with sciatica, left side: Secondary | ICD-10-CM | POA: Diagnosis not present

## 2021-02-21 DIAGNOSIS — M48062 Spinal stenosis, lumbar region with neurogenic claudication: Secondary | ICD-10-CM | POA: Diagnosis not present

## 2021-03-08 DIAGNOSIS — M48062 Spinal stenosis, lumbar region with neurogenic claudication: Secondary | ICD-10-CM | POA: Diagnosis not present

## 2021-03-08 DIAGNOSIS — G8929 Other chronic pain: Secondary | ICD-10-CM | POA: Diagnosis not present

## 2021-03-08 DIAGNOSIS — M5442 Lumbago with sciatica, left side: Secondary | ICD-10-CM | POA: Diagnosis not present

## 2021-03-11 DIAGNOSIS — M48062 Spinal stenosis, lumbar region with neurogenic claudication: Secondary | ICD-10-CM | POA: Diagnosis not present

## 2021-03-14 DIAGNOSIS — Z872 Personal history of diseases of the skin and subcutaneous tissue: Secondary | ICD-10-CM | POA: Diagnosis not present

## 2021-03-14 DIAGNOSIS — Z86018 Personal history of other benign neoplasm: Secondary | ICD-10-CM | POA: Diagnosis not present

## 2021-03-14 DIAGNOSIS — L570 Actinic keratosis: Secondary | ICD-10-CM | POA: Diagnosis not present

## 2021-03-14 DIAGNOSIS — L578 Other skin changes due to chronic exposure to nonionizing radiation: Secondary | ICD-10-CM | POA: Diagnosis not present

## 2021-03-14 DIAGNOSIS — L821 Other seborrheic keratosis: Secondary | ICD-10-CM | POA: Diagnosis not present

## 2021-03-14 DIAGNOSIS — Z85828 Personal history of other malignant neoplasm of skin: Secondary | ICD-10-CM | POA: Diagnosis not present

## 2021-03-14 DIAGNOSIS — Z859 Personal history of malignant neoplasm, unspecified: Secondary | ICD-10-CM | POA: Diagnosis not present

## 2021-03-29 DIAGNOSIS — M47816 Spondylosis without myelopathy or radiculopathy, lumbar region: Secondary | ICD-10-CM | POA: Diagnosis not present

## 2021-03-29 DIAGNOSIS — M5442 Lumbago with sciatica, left side: Secondary | ICD-10-CM | POA: Diagnosis not present

## 2021-03-29 DIAGNOSIS — M48062 Spinal stenosis, lumbar region with neurogenic claudication: Secondary | ICD-10-CM | POA: Diagnosis not present

## 2021-03-29 DIAGNOSIS — G8929 Other chronic pain: Secondary | ICD-10-CM | POA: Diagnosis not present

## 2021-04-01 DIAGNOSIS — M47816 Spondylosis without myelopathy or radiculopathy, lumbar region: Secondary | ICD-10-CM | POA: Diagnosis not present

## 2021-04-12 DIAGNOSIS — M5441 Lumbago with sciatica, right side: Secondary | ICD-10-CM | POA: Diagnosis not present

## 2021-04-12 DIAGNOSIS — M5442 Lumbago with sciatica, left side: Secondary | ICD-10-CM | POA: Diagnosis not present

## 2021-04-12 DIAGNOSIS — M47816 Spondylosis without myelopathy or radiculopathy, lumbar region: Secondary | ICD-10-CM | POA: Diagnosis not present

## 2021-04-12 DIAGNOSIS — M48062 Spinal stenosis, lumbar region with neurogenic claudication: Secondary | ICD-10-CM | POA: Diagnosis not present

## 2021-04-12 DIAGNOSIS — G8929 Other chronic pain: Secondary | ICD-10-CM | POA: Diagnosis not present

## 2021-04-18 DIAGNOSIS — M47816 Spondylosis without myelopathy or radiculopathy, lumbar region: Secondary | ICD-10-CM | POA: Diagnosis not present

## 2021-04-26 DIAGNOSIS — M5441 Lumbago with sciatica, right side: Secondary | ICD-10-CM | POA: Diagnosis not present

## 2021-04-26 DIAGNOSIS — M48062 Spinal stenosis, lumbar region with neurogenic claudication: Secondary | ICD-10-CM | POA: Diagnosis not present

## 2021-04-26 DIAGNOSIS — G8929 Other chronic pain: Secondary | ICD-10-CM | POA: Diagnosis not present

## 2021-04-26 DIAGNOSIS — M47816 Spondylosis without myelopathy or radiculopathy, lumbar region: Secondary | ICD-10-CM | POA: Diagnosis not present

## 2021-04-26 DIAGNOSIS — M5442 Lumbago with sciatica, left side: Secondary | ICD-10-CM | POA: Diagnosis not present

## 2021-05-17 DIAGNOSIS — G8929 Other chronic pain: Secondary | ICD-10-CM | POA: Diagnosis not present

## 2021-05-17 DIAGNOSIS — M48062 Spinal stenosis, lumbar region with neurogenic claudication: Secondary | ICD-10-CM | POA: Diagnosis not present

## 2021-05-17 DIAGNOSIS — M5442 Lumbago with sciatica, left side: Secondary | ICD-10-CM | POA: Diagnosis not present

## 2021-05-27 DIAGNOSIS — M5442 Lumbago with sciatica, left side: Secondary | ICD-10-CM | POA: Diagnosis not present

## 2021-05-27 DIAGNOSIS — G8929 Other chronic pain: Secondary | ICD-10-CM | POA: Diagnosis not present

## 2021-05-27 DIAGNOSIS — M419 Scoliosis, unspecified: Secondary | ICD-10-CM | POA: Diagnosis not present

## 2021-07-12 DIAGNOSIS — M419 Scoliosis, unspecified: Secondary | ICD-10-CM | POA: Diagnosis not present

## 2021-07-12 DIAGNOSIS — G8929 Other chronic pain: Secondary | ICD-10-CM | POA: Diagnosis not present

## 2021-07-12 DIAGNOSIS — E2839 Other primary ovarian failure: Secondary | ICD-10-CM | POA: Diagnosis not present

## 2021-07-12 DIAGNOSIS — M5442 Lumbago with sciatica, left side: Secondary | ICD-10-CM | POA: Diagnosis not present

## 2021-07-18 DIAGNOSIS — E538 Deficiency of other specified B group vitamins: Secondary | ICD-10-CM | POA: Diagnosis not present

## 2021-07-18 DIAGNOSIS — E782 Mixed hyperlipidemia: Secondary | ICD-10-CM | POA: Diagnosis not present

## 2021-07-22 ENCOUNTER — Other Ambulatory Visit: Payer: Self-pay | Admitting: Neurosurgery

## 2021-07-22 DIAGNOSIS — Z01818 Encounter for other preprocedural examination: Secondary | ICD-10-CM

## 2021-07-25 DIAGNOSIS — M5441 Lumbago with sciatica, right side: Secondary | ICD-10-CM | POA: Diagnosis not present

## 2021-07-25 DIAGNOSIS — Z01810 Encounter for preprocedural cardiovascular examination: Secondary | ICD-10-CM | POA: Diagnosis not present

## 2021-07-25 DIAGNOSIS — Z0000 Encounter for general adult medical examination without abnormal findings: Secondary | ICD-10-CM | POA: Diagnosis not present

## 2021-07-25 DIAGNOSIS — G8929 Other chronic pain: Secondary | ICD-10-CM | POA: Diagnosis not present

## 2021-07-25 DIAGNOSIS — M5442 Lumbago with sciatica, left side: Secondary | ICD-10-CM | POA: Diagnosis not present

## 2021-07-25 DIAGNOSIS — M4146 Neuromuscular scoliosis, lumbar region: Secondary | ICD-10-CM | POA: Diagnosis not present

## 2021-07-25 DIAGNOSIS — Z780 Asymptomatic menopausal state: Secondary | ICD-10-CM | POA: Diagnosis not present

## 2021-07-25 DIAGNOSIS — E782 Mixed hyperlipidemia: Secondary | ICD-10-CM | POA: Diagnosis not present

## 2021-07-25 DIAGNOSIS — E538 Deficiency of other specified B group vitamins: Secondary | ICD-10-CM | POA: Diagnosis not present

## 2021-08-01 ENCOUNTER — Other Ambulatory Visit: Payer: Self-pay

## 2021-08-01 ENCOUNTER — Encounter
Admission: RE | Admit: 2021-08-01 | Discharge: 2021-08-01 | Disposition: A | Discharge status: Home / Self Care | Payer: PPO | Source: Ambulatory Visit | Attending: Neurosurgery | Admitting: Neurosurgery

## 2021-08-01 DIAGNOSIS — Z01812 Encounter for preprocedural laboratory examination: Secondary | ICD-10-CM | POA: Insufficient documentation

## 2021-08-01 LAB — TYPE AND SCREEN
ABO/RH(D): A POS
Antibody Screen: NEGATIVE

## 2021-08-01 LAB — PROTIME-INR
INR: 1 (ref 0.8–1.2)
Prothrombin Time: 13.5 seconds (ref 11.4–15.2)

## 2021-08-01 LAB — APTT: aPTT: 34 seconds (ref 24–36)

## 2021-08-01 LAB — SURGICAL PCR SCREEN
MRSA, PCR: NEGATIVE
Staphylococcus aureus: NEGATIVE

## 2021-08-01 NOTE — Patient Instructions (Addendum)
Your procedure is scheduled on: 08/15/2021 Report to the Registration Desk on the 1st floor of the [facility].  To find out your arrival time, please call [phone] between 1PM - 3PM on: 3/3./2023   REMEMBER: Instructions that are not followed completely may result in serious medical risk, up to and including death; or upon the discretion of your surgeon and anesthesiologist your surgery may need to be rescheduled.  Do not eat food after midnight the night before surgery.  No gum chewing, lozengers or hard candies.  You may however, drink CLEAR liquids up to 2 hours before you are scheduled to arrive for your surgery. Do not drink anything within 2 hours of your scheduled arrival time.  Clear liquids include: - water  - apple juice without pulp - gatorade (not RED colors) - black coffee or tea (Do NOT add milk or creamers to the coffee or tea) Do NOT drink anything that is not on this list.    TAKE THESE MEDICATIONS THE MORNING OF SURGERY WITH A SIP OF WATER:  Klorcon  tramadol    Follow recommendations from Cardiologist, Pulmonologist or PCP regarding stopping Aspirin, Coumadin, Plavix, Eliquis, Pradaxa, or Pletal. Stop aspirin 7 days prior to surgery. Stop diclofenac 7 days prior to surgery. Last dose is Aug 07, 2021 of aspirin  and diclofenac  One week prior to surgery: Stop Anti-inflammatories (NSAIDS) such as Advil, Aleve, Ibuprofen, Motrin, Naproxen, Naprosyn and Aspirin based products such as Excedrin, Goodys Powder, BC Powder.  Stop ANY OVER THE COUNTER supplements until after surgery Vit d3, B12,  You may however, continue to take Tylenol if needed for pain up until the day of surgery.  No Alcohol for 24 hours before or after surgery.  No Smoking including e-cigarettes for 24 hours prior to surgery.  No chewable tobacco products for at least 6 hours prior to surgery.  No nicotine patches on the day of surgery.  Do not use any "recreational" drugs for at least  a week prior to your surgery.  Please be advised that the combination of cocaine and anesthesia may have negative outcomes, up to and including death. If you test positive for cocaine, your surgery will be cancelled.  On the morning of surgery brush your teeth with toothpaste and water, you may rinse your mouth with mouthwash if you wish. Do not swallow any toothpaste or mouthwash.  Use CHG Soap or wipes as directed on instruction sheet.  Do not wear jewelry, make-up, hairpins, clips or nail polish.  Do not wear lotions, powders, or perfumes.   Do not shave body from the neck down 48 hours prior to surgery just in case you cut yourself which could leave a site for infection.  Also, freshly shaved skin may become irritated if using the CHG soap.  Contact lenses, hearing aids and dentures may not be worn into surgery.  Do not bring valuables to the hospital. [facility] is not responsible for any missing/lost belongings or valuables.     Notify your doctor if there is any change in your medical condition (cold, fever, infection).  Wear comfortable clothing (specific to your surgery type) to the hospital.  After surgery, you can help prevent lung complications by doing breathing exercises.  Take deep breaths and cough every 1-2 hours. Your doctor may order a device called an Incentive Spirometer to help you take deep breaths.   If you are being admitted to the hospital overnight, leave your suitcase in the car. After surgery  it may be brought to your room.  If you are being discharged the day of surgery, you will not be allowed to drive home. You will need a responsible adult (18 years or older) to drive you home and stay with you that night.   If you are taking public transportation, you will need to have a responsible adult (18 years or older) with you. Please confirm with your physician that it is acceptable to use public transportation.   Please call the [facility]  Dept. at [phone] if you have any questions about these instructions.  Surgery Visitation Policy:  Patients undergoing a surgery or procedure may have one family member or support person with them as long as that person is not COVID-19 positive or experiencing its symptoms.  That person may remain in the waiting area during the procedure and may rotate out with other people.  Inpatient Visitation:    Visiting hours are 7 a.m. to 8 p.m. Up to two visitors ages 16+ are allowed at one time in a patient room. The visitors may rotate out with other people during the day. Visitors must check out when they leave, or other visitors will not be allowed. One designated support person may remain overnight. The visitor must pass COVID-19 screenings, use hand sanitizer when entering and exiting the patients room and wear a mask at all times, including in the patients room. Patients must also wear a mask when staff or their visitor are in the room. Masking is required regardless of vaccination status.

## 2021-08-03 DIAGNOSIS — M419 Scoliosis, unspecified: Secondary | ICD-10-CM | POA: Diagnosis not present

## 2021-08-12 ENCOUNTER — Other Ambulatory Visit
Admission: RE | Admit: 2021-08-12 | Discharge: 2021-08-12 | Disposition: A | Discharge status: Home / Self Care | Payer: PPO | Source: Ambulatory Visit | Attending: Neurosurgery | Admitting: Neurosurgery

## 2021-08-12 ENCOUNTER — Other Ambulatory Visit: Payer: Self-pay

## 2021-08-12 DIAGNOSIS — M48061 Spinal stenosis, lumbar region without neurogenic claudication: Secondary | ICD-10-CM | POA: Diagnosis present

## 2021-08-12 DIAGNOSIS — Z85828 Personal history of other malignant neoplasm of skin: Secondary | ICD-10-CM | POA: Diagnosis not present

## 2021-08-12 DIAGNOSIS — Z90710 Acquired absence of both cervix and uterus: Secondary | ICD-10-CM | POA: Diagnosis not present

## 2021-08-12 DIAGNOSIS — M545 Low back pain, unspecified: Secondary | ICD-10-CM | POA: Diagnosis present

## 2021-08-12 DIAGNOSIS — M4186 Other forms of scoliosis, lumbar region: Secondary | ICD-10-CM | POA: Diagnosis present

## 2021-08-12 DIAGNOSIS — M4807 Spinal stenosis, lumbosacral region: Secondary | ICD-10-CM | POA: Diagnosis present

## 2021-08-12 DIAGNOSIS — G8929 Other chronic pain: Secondary | ICD-10-CM | POA: Diagnosis present

## 2021-08-12 DIAGNOSIS — Z79899 Other long term (current) drug therapy: Secondary | ICD-10-CM | POA: Diagnosis not present

## 2021-08-12 DIAGNOSIS — M5117 Intervertebral disc disorders with radiculopathy, lumbosacral region: Secondary | ICD-10-CM | POA: Diagnosis present

## 2021-08-12 DIAGNOSIS — Z1152 Encounter for screening for COVID-19: Secondary | ICD-10-CM

## 2021-08-12 DIAGNOSIS — R531 Weakness: Secondary | ICD-10-CM | POA: Diagnosis not present

## 2021-08-12 DIAGNOSIS — Z7982 Long term (current) use of aspirin: Secondary | ICD-10-CM | POA: Diagnosis not present

## 2021-08-12 DIAGNOSIS — Z01812 Encounter for preprocedural laboratory examination: Secondary | ICD-10-CM | POA: Insufficient documentation

## 2021-08-12 DIAGNOSIS — M4327 Fusion of spine, lumbosacral region: Secondary | ICD-10-CM | POA: Diagnosis not present

## 2021-08-12 DIAGNOSIS — M1990 Unspecified osteoarthritis, unspecified site: Secondary | ICD-10-CM | POA: Diagnosis present

## 2021-08-12 DIAGNOSIS — M5442 Lumbago with sciatica, left side: Secondary | ICD-10-CM | POA: Diagnosis not present

## 2021-08-12 DIAGNOSIS — Z20822 Contact with and (suspected) exposure to covid-19: Secondary | ICD-10-CM | POA: Diagnosis present

## 2021-08-12 DIAGNOSIS — M5116 Intervertebral disc disorders with radiculopathy, lumbar region: Secondary | ICD-10-CM | POA: Diagnosis present

## 2021-08-12 DIAGNOSIS — I959 Hypotension, unspecified: Secondary | ICD-10-CM | POA: Diagnosis not present

## 2021-08-12 DIAGNOSIS — Z96653 Presence of artificial knee joint, bilateral: Secondary | ICD-10-CM | POA: Diagnosis present

## 2021-08-12 DIAGNOSIS — J302 Other seasonal allergic rhinitis: Secondary | ICD-10-CM | POA: Diagnosis present

## 2021-08-12 DIAGNOSIS — M4326 Fusion of spine, lumbar region: Secondary | ICD-10-CM | POA: Diagnosis not present

## 2021-08-12 DIAGNOSIS — M4726 Other spondylosis with radiculopathy, lumbar region: Secondary | ICD-10-CM | POA: Diagnosis present

## 2021-08-12 DIAGNOSIS — I10 Essential (primary) hypertension: Secondary | ICD-10-CM | POA: Diagnosis present

## 2021-08-12 DIAGNOSIS — Z79891 Long term (current) use of opiate analgesic: Secondary | ICD-10-CM | POA: Diagnosis not present

## 2021-08-12 LAB — SARS CORONAVIRUS 2 (TAT 6-24 HRS): SARS Coronavirus 2: NEGATIVE

## 2021-08-15 ENCOUNTER — Inpatient Hospital Stay (HOSPITAL_COMMUNITY): Payer: PPO

## 2021-08-15 ENCOUNTER — Inpatient Hospital Stay: Payer: PPO | Admitting: Anesthesiology

## 2021-08-15 ENCOUNTER — Encounter
Admission: RE | Disposition: A | Discharge status: Home Health | Payer: Self-pay | Source: Home / Self Care | Attending: Neurosurgery

## 2021-08-15 ENCOUNTER — Other Ambulatory Visit: Payer: Self-pay

## 2021-08-15 ENCOUNTER — Inpatient Hospital Stay: Payer: PPO | Admitting: Urgent Care

## 2021-08-15 ENCOUNTER — Encounter: Payer: Self-pay | Admitting: Neurosurgery

## 2021-08-15 ENCOUNTER — Inpatient Hospital Stay: Payer: PPO

## 2021-08-15 ENCOUNTER — Inpatient Hospital Stay
Admission: RE | Admit: 2021-08-15 | Discharge: 2021-08-16 | DRG: 455 | Disposition: A | Discharge status: Home Health | Payer: PPO | Attending: Neurosurgery | Admitting: Neurosurgery

## 2021-08-15 DIAGNOSIS — M545 Low back pain, unspecified: Secondary | ICD-10-CM | POA: Diagnosis present

## 2021-08-15 DIAGNOSIS — Z9889 Other specified postprocedural states: Secondary | ICD-10-CM

## 2021-08-15 DIAGNOSIS — M5117 Intervertebral disc disorders with radiculopathy, lumbosacral region: Secondary | ICD-10-CM | POA: Diagnosis present

## 2021-08-15 DIAGNOSIS — M4807 Spinal stenosis, lumbosacral region: Secondary | ICD-10-CM | POA: Diagnosis present

## 2021-08-15 DIAGNOSIS — Z85828 Personal history of other malignant neoplasm of skin: Secondary | ICD-10-CM

## 2021-08-15 DIAGNOSIS — Z96653 Presence of artificial knee joint, bilateral: Secondary | ICD-10-CM | POA: Diagnosis present

## 2021-08-15 DIAGNOSIS — M4186 Other forms of scoliosis, lumbar region: Secondary | ICD-10-CM | POA: Diagnosis present

## 2021-08-15 DIAGNOSIS — J302 Other seasonal allergic rhinitis: Secondary | ICD-10-CM | POA: Diagnosis present

## 2021-08-15 DIAGNOSIS — M48061 Spinal stenosis, lumbar region without neurogenic claudication: Principal | ICD-10-CM | POA: Diagnosis present

## 2021-08-15 DIAGNOSIS — M5116 Intervertebral disc disorders with radiculopathy, lumbar region: Secondary | ICD-10-CM | POA: Diagnosis present

## 2021-08-15 DIAGNOSIS — Z79899 Other long term (current) drug therapy: Secondary | ICD-10-CM

## 2021-08-15 DIAGNOSIS — Z7982 Long term (current) use of aspirin: Secondary | ICD-10-CM | POA: Diagnosis not present

## 2021-08-15 DIAGNOSIS — M4326 Fusion of spine, lumbar region: Secondary | ICD-10-CM | POA: Diagnosis not present

## 2021-08-15 DIAGNOSIS — Z20822 Contact with and (suspected) exposure to covid-19: Secondary | ICD-10-CM | POA: Diagnosis present

## 2021-08-15 DIAGNOSIS — Z79891 Long term (current) use of opiate analgesic: Secondary | ICD-10-CM | POA: Diagnosis not present

## 2021-08-15 DIAGNOSIS — G8929 Other chronic pain: Secondary | ICD-10-CM | POA: Diagnosis present

## 2021-08-15 DIAGNOSIS — M4726 Other spondylosis with radiculopathy, lumbar region: Secondary | ICD-10-CM | POA: Diagnosis present

## 2021-08-15 DIAGNOSIS — I10 Essential (primary) hypertension: Secondary | ICD-10-CM | POA: Diagnosis present

## 2021-08-15 DIAGNOSIS — Z419 Encounter for procedure for purposes other than remedying health state, unspecified: Secondary | ICD-10-CM

## 2021-08-15 DIAGNOSIS — Z981 Arthrodesis status: Secondary | ICD-10-CM

## 2021-08-15 DIAGNOSIS — I959 Hypotension, unspecified: Secondary | ICD-10-CM | POA: Diagnosis not present

## 2021-08-15 DIAGNOSIS — M1990 Unspecified osteoarthritis, unspecified site: Secondary | ICD-10-CM | POA: Diagnosis present

## 2021-08-15 DIAGNOSIS — Z90710 Acquired absence of both cervix and uterus: Secondary | ICD-10-CM

## 2021-08-15 HISTORY — PX: TRANSFORAMINAL LUMBAR INTERBODY FUSION W/ MIS 2 LEVEL: SHX6146

## 2021-08-15 HISTORY — PX: APPLICATION OF INTRAOPERATIVE CT SCAN: SHX6668

## 2021-08-15 LAB — CBC
HCT: 38.6 % (ref 36.0–46.0)
Hemoglobin: 12.2 g/dL (ref 12.0–15.0)
MCH: 30.3 pg (ref 26.0–34.0)
MCHC: 31.6 g/dL (ref 30.0–36.0)
MCV: 95.8 fL (ref 80.0–100.0)
Platelets: 216 10*3/uL (ref 150–400)
RBC: 4.03 MIL/uL (ref 3.87–5.11)
RDW: 13.1 % (ref 11.5–15.5)
WBC: 13.4 10*3/uL — ABNORMAL HIGH (ref 4.0–10.5)
nRBC: 0 % (ref 0.0–0.2)

## 2021-08-15 LAB — POCT I-STAT, CHEM 8
BUN: 19 mg/dL (ref 8–23)
Calcium, Ion: 1.25 mmol/L (ref 1.15–1.40)
Chloride: 100 mmol/L (ref 98–111)
Creatinine, Ser: 0.9 mg/dL (ref 0.44–1.00)
Glucose, Bld: 107 mg/dL — ABNORMAL HIGH (ref 70–99)
HCT: 40 % (ref 36.0–46.0)
Hemoglobin: 13.6 g/dL (ref 12.0–15.0)
Potassium: 4.4 mmol/L (ref 3.5–5.1)
Sodium: 137 mmol/L (ref 135–145)
TCO2: 30 mmol/L (ref 22–32)

## 2021-08-15 LAB — CREATININE, SERUM
Creatinine, Ser: 0.75 mg/dL (ref 0.44–1.00)
GFR, Estimated: >60 mL/min (ref >60)

## 2021-08-15 LAB — ABO/RH: ABO/RH(D): A POS

## 2021-08-15 SURGERY — MINIMALLY INVASIVE (MIS) TRANSFORAMINAL LUMBAR INTERBODY FUSION (TLIF) 2 LEVEL
Anesthesia: General | Site: Spine Lumbar

## 2021-08-15 MED ORDER — ONDANSETRON HCL 4 MG/2ML IJ SOLN
INTRAMUSCULAR | Status: DC | PRN
  Administered 2021-08-15: 4 mg via INTRAVENOUS

## 2021-08-15 MED ORDER — SODIUM CHLORIDE 0.9% FLUSH: 3.0000 mL | INTRAVENOUS | Status: DC | PRN

## 2021-08-15 MED ORDER — 0.9 % SODIUM CHLORIDE (POUR BTL) OPTIME
TOPICAL | Status: DC | PRN
  Administered 2021-08-15: 1000 mL

## 2021-08-15 MED ORDER — SODIUM CHLORIDE 0.9 % IV SOLN: INTRAVENOUS | Status: DC

## 2021-08-15 MED ORDER — SODIUM CHLORIDE FLUSH 0.9 % IV SOLN
INTRAVENOUS | Status: AC
  Filled 2021-08-15: qty 20

## 2021-08-15 MED ORDER — ONDANSETRON HCL 4 MG PO TABS: 4.0000 mg | ORAL_TABLET | Freq: Four times a day (QID) | ORAL | Status: DC | PRN

## 2021-08-15 MED ORDER — FLEET ENEMA 7-19 GM/118ML RE ENEM: 1.0000 | ENEMA | Freq: Once | RECTAL | Status: DC | PRN

## 2021-08-15 MED ORDER — SODIUM CHLORIDE 0.9 % IV SOLN: 250.0000 mL | INTRAVENOUS | Status: DC

## 2021-08-15 MED ORDER — VANCOMYCIN HCL 1500 MG/300ML IV SOLN
1500.0000 mg | INTRAVENOUS | Status: AC
  Administered 2021-08-15: 1500 mg via INTRAVENOUS
  Filled 2021-08-15: qty 300

## 2021-08-15 MED ORDER — KETOROLAC TROMETHAMINE 15 MG/ML IJ SOLN
7.5000 mg | Freq: Four times a day (QID) | INTRAMUSCULAR | Status: AC
  Administered 2021-08-15 – 2021-08-16 (×4): 7.5 mg via INTRAVENOUS
  Filled 2021-08-15 (×4): qty 1

## 2021-08-15 MED ORDER — ACETAMINOPHEN 10 MG/ML IV SOLN
INTRAVENOUS | Status: AC
  Filled 2021-08-15: qty 100

## 2021-08-15 MED ORDER — OXYCODONE HCL 5 MG PO TABS
5.0000 mg | ORAL_TABLET | ORAL | Status: DC | PRN
  Administered 2021-08-15 – 2021-08-16 (×3): 5 mg via ORAL
  Filled 2021-08-15 (×2): qty 1

## 2021-08-15 MED ORDER — REMIFENTANIL HCL 1 MG IV SOLR
INTRAVENOUS | Status: AC
  Filled 2021-08-15: qty 1000

## 2021-08-15 MED ORDER — PROPOFOL 1000 MG/100ML IV EMUL
INTRAVENOUS | Status: AC
  Filled 2021-08-15: qty 100

## 2021-08-15 MED ORDER — PROPOFOL 10 MG/ML IV BOLUS
INTRAVENOUS | Status: AC
  Filled 2021-08-15: qty 20

## 2021-08-15 MED ORDER — POTASSIUM CHLORIDE CRYS ER 10 MEQ PO TBCR
10.0000 meq | EXTENDED_RELEASE_TABLET | Freq: Every day | ORAL | Status: DC
  Administered 2021-08-16: 10 meq via ORAL
  Filled 2021-08-15: qty 1

## 2021-08-15 MED ORDER — FENTANYL CITRATE (PF) 100 MCG/2ML IJ SOLN
INTRAMUSCULAR | Status: DC | PRN
Start: 2021-08-15 — End: 2021-08-15
  Administered 2021-08-15 (×2): 25 ug via INTRAVENOUS
  Administered 2021-08-15: 50 ug via INTRAVENOUS

## 2021-08-15 MED ORDER — LISINOPRIL 20 MG PO TABS
40.0000 mg | ORAL_TABLET | Freq: Every day | ORAL | Status: DC
  Administered 2021-08-15: 40 mg via ORAL
  Filled 2021-08-15: qty 2

## 2021-08-15 MED ORDER — SUCCINYLCHOLINE CHLORIDE 200 MG/10ML IV SOSY
PREFILLED_SYRINGE | INTRAVENOUS | Status: DC | PRN
  Administered 2021-08-15: 100 mg via INTRAVENOUS

## 2021-08-15 MED ORDER — GLYCOPYRROLATE 0.2 MG/ML IJ SOLN
INTRAMUSCULAR | Status: DC | PRN
  Administered 2021-08-15 (×2): .1 mg via INTRAVENOUS

## 2021-08-15 MED ORDER — VITAMIN D 25 MCG (1000 UNIT) PO TABS: 5000.0000 [IU] | ORAL_TABLET | ORAL | Status: DC

## 2021-08-15 MED ORDER — ACETAMINOPHEN 10 MG/ML IV SOLN
INTRAVENOUS | Status: DC | PRN
  Administered 2021-08-15: 1000 mg via INTRAVENOUS

## 2021-08-15 MED ORDER — SODIUM CHLORIDE 0.9% FLUSH
3.0000 mL | Freq: Two times a day (BID) | INTRAVENOUS | Status: DC
  Administered 2021-08-15 – 2021-08-16 (×2): 3 mL via INTRAVENOUS

## 2021-08-15 MED ORDER — CHLORHEXIDINE GLUCONATE 0.12 % MT SOLN: 15.0000 mL | Freq: Once | OROMUCOSAL | Status: AC

## 2021-08-15 MED ORDER — FENTANYL CITRATE (PF) 100 MCG/2ML IJ SOLN
INTRAMUSCULAR | Status: AC
  Filled 2021-08-15: qty 2

## 2021-08-15 MED ORDER — FAMOTIDINE 20 MG PO TABS
ORAL_TABLET | ORAL | Status: AC
  Filled 2021-08-15: qty 1

## 2021-08-15 MED ORDER — ORAL CARE MOUTH RINSE: 15.0000 mL | Freq: Once | OROMUCOSAL | Status: AC

## 2021-08-15 MED ORDER — REMIFENTANIL HCL 1 MG IV SOLR
INTRAVENOUS | Status: DC | PRN
  Administered 2021-08-15: .1 ug/kg/min via INTRAVENOUS

## 2021-08-15 MED ORDER — ONDANSETRON HCL 4 MG/2ML IJ SOLN
INTRAMUSCULAR | Status: AC
  Filled 2021-08-15: qty 2

## 2021-08-15 MED ORDER — MENTHOL 3 MG MT LOZG
1.0000 | LOZENGE | OROMUCOSAL | Status: DC | PRN
  Filled 2021-08-15: qty 9

## 2021-08-15 MED ORDER — CEFAZOLIN SODIUM-DEXTROSE 2-4 GM/100ML-% IV SOLN
INTRAVENOUS | Status: AC
  Filled 2021-08-15: qty 100

## 2021-08-15 MED ORDER — LIDOCAINE HCL (CARDIAC) PF 100 MG/5ML IV SOSY
PREFILLED_SYRINGE | INTRAVENOUS | Status: DC | PRN
  Administered 2021-08-15: 100 mg via INTRAVENOUS

## 2021-08-15 MED ORDER — LIDOCAINE HCL (PF) 2 % IJ SOLN
INTRAMUSCULAR | Status: AC
  Filled 2021-08-15: qty 5

## 2021-08-15 MED ORDER — MIDAZOLAM HCL 2 MG/2ML IJ SOLN
INTRAMUSCULAR | Status: DC | PRN
  Administered 2021-08-15: 2 mg via INTRAVENOUS

## 2021-08-15 MED ORDER — GLYCOPYRROLATE 0.2 MG/ML IJ SOLN
INTRAMUSCULAR | Status: AC
  Filled 2021-08-15: qty 1

## 2021-08-15 MED ORDER — FUROSEMIDE 20 MG PO TABS
20.0000 mg | ORAL_TABLET | Freq: Every day | ORAL | Status: DC
  Administered 2021-08-15: 20 mg via ORAL
  Filled 2021-08-15: qty 1

## 2021-08-15 MED ORDER — MORPHINE SULFATE (PF) 2 MG/ML IV SOLN: 2.0000 mg | INTRAVENOUS | Status: DC | PRN

## 2021-08-15 MED ORDER — SURGIFLO WITH THROMBIN (HEMOSTATIC MATRIX KIT) OPTIME
TOPICAL | Status: DC | PRN
  Administered 2021-08-15: 1 via TOPICAL

## 2021-08-15 MED ORDER — SODIUM CHLORIDE (PF) 0.9 % IJ SOLN
INTRAMUSCULAR | Status: DC | PRN
  Administered 2021-08-15: 60 mL

## 2021-08-15 MED ORDER — VITAMIN B-12 1000 MCG PO TABS
5000.0000 ug | ORAL_TABLET | Freq: Every day | ORAL | Status: DC
  Administered 2021-08-15 – 2021-08-16 (×2): 5000 ug via ORAL
  Filled 2021-08-15 (×2): qty 5

## 2021-08-15 MED ORDER — LACTATED RINGERS IV SOLN: INTRAVENOUS | Status: DC

## 2021-08-15 MED ORDER — FENTANYL CITRATE (PF) 100 MCG/2ML IJ SOLN
25.0000 ug | INTRAMUSCULAR | Status: AC | PRN
  Administered 2021-08-15 (×6): 25 ug via INTRAVENOUS

## 2021-08-15 MED ORDER — ACETAMINOPHEN 500 MG PO TABS
1000.0000 mg | ORAL_TABLET | Freq: Four times a day (QID) | ORAL | Status: AC
  Administered 2021-08-15 – 2021-08-16 (×4): 1000 mg via ORAL
  Filled 2021-08-15 (×4): qty 2

## 2021-08-15 MED ORDER — DEXAMETHASONE SODIUM PHOSPHATE 10 MG/ML IJ SOLN
INTRAMUSCULAR | Status: DC | PRN
  Administered 2021-08-15: 10 mg via INTRAVENOUS

## 2021-08-15 MED ORDER — PROPOFOL 10 MG/ML IV BOLUS
INTRAVENOUS | Status: DC | PRN
  Administered 2021-08-15: 90 mg via INTRAVENOUS

## 2021-08-15 MED ORDER — ENOXAPARIN SODIUM 40 MG/0.4ML IJ SOSY
40.0000 mg | PREFILLED_SYRINGE | INTRAMUSCULAR | Status: DC
  Administered 2021-08-16: 40 mg via SUBCUTANEOUS
  Filled 2021-08-15: qty 0.4

## 2021-08-15 MED ORDER — SUCCINYLCHOLINE CHLORIDE 200 MG/10ML IV SOSY
PREFILLED_SYRINGE | INTRAVENOUS | Status: AC
Start: 2021-08-15 — End: ?
  Filled 2021-08-15: qty 10

## 2021-08-15 MED ORDER — SENNA 8.6 MG PO TABS
1.0000 | ORAL_TABLET | Freq: Two times a day (BID) | ORAL | Status: DC
  Administered 2021-08-15 – 2021-08-16 (×2): 8.6 mg via ORAL
  Filled 2021-08-15 (×2): qty 1

## 2021-08-15 MED ORDER — OXYCODONE HCL 5 MG PO TABS
10.0000 mg | ORAL_TABLET | ORAL | Status: DC | PRN
  Filled 2021-08-15: qty 2

## 2021-08-15 MED ORDER — CHLORHEXIDINE GLUCONATE 0.12 % MT SOLN
OROMUCOSAL | Status: AC
  Administered 2021-08-15: 15 mL via OROMUCOSAL
  Filled 2021-08-15: qty 15

## 2021-08-15 MED ORDER — CHLORHEXIDINE GLUCONATE 0.12 % MT SOLN
OROMUCOSAL | Status: AC
  Filled 2021-08-15: qty 15

## 2021-08-15 MED ORDER — PHENYLEPHRINE 40 MCG/ML (10ML) SYRINGE FOR IV PUSH (FOR BLOOD PRESSURE SUPPORT)
PREFILLED_SYRINGE | INTRAVENOUS | Status: DC | PRN
Start: 2021-08-15 — End: 2021-08-15
  Administered 2021-08-15 (×2): 80 ug via INTRAVENOUS

## 2021-08-15 MED ORDER — FENTANYL CITRATE (PF) 100 MCG/2ML IJ SOLN
INTRAMUSCULAR | Status: AC
  Administered 2021-08-15: 25 ug via INTRAVENOUS
  Filled 2021-08-15: qty 2

## 2021-08-15 MED ORDER — ONDANSETRON HCL 4 MG/2ML IJ SOLN: 4.0000 mg | Freq: Once | INTRAMUSCULAR | Status: DC | PRN

## 2021-08-15 MED ORDER — BUPIVACAINE-EPINEPHRINE (PF) 0.5% -1:200000 IJ SOLN
INTRAMUSCULAR | Status: AC
Start: 2021-08-15 — End: ?
  Filled 2021-08-15: qty 30

## 2021-08-15 MED ORDER — TRAMADOL HCL 50 MG PO TABS
100.0000 mg | ORAL_TABLET | Freq: Every day | ORAL | Status: DC
  Administered 2021-08-15: 100 mg via ORAL
  Filled 2021-08-15: qty 2

## 2021-08-15 MED ORDER — FAMOTIDINE 20 MG PO TABS: 20.0000 mg | ORAL_TABLET | Freq: Once | ORAL | Status: AC

## 2021-08-15 MED ORDER — HYDROMORPHONE HCL 1 MG/ML IJ SOLN
INTRAMUSCULAR | Status: AC
  Filled 2021-08-15: qty 1

## 2021-08-15 MED ORDER — GABAPENTIN 300 MG PO CAPS
600.0000 mg | ORAL_CAPSULE | Freq: Every day | ORAL | Status: DC
  Administered 2021-08-15: 600 mg via ORAL
  Filled 2021-08-15: qty 2

## 2021-08-15 MED ORDER — FAMOTIDINE 20 MG PO TABS
ORAL_TABLET | ORAL | Status: AC
  Administered 2021-08-15: 20 mg via ORAL
  Filled 2021-08-15: qty 1

## 2021-08-15 MED ORDER — CEFAZOLIN SODIUM-DEXTROSE 2-4 GM/100ML-% IV SOLN
2.0000 g | INTRAVENOUS | Status: AC
  Administered 2021-08-15: 2 g via INTRAVENOUS

## 2021-08-15 MED ORDER — PHENOL 1.4 % MT LIQD
1.0000 | OROMUCOSAL | Status: DC | PRN
  Filled 2021-08-15: qty 177

## 2021-08-15 MED ORDER — BISACODYL 10 MG RE SUPP: 10.0000 mg | Freq: Every day | RECTAL | Status: DC | PRN

## 2021-08-15 MED ORDER — HYDROMORPHONE HCL 1 MG/ML IJ SOLN
INTRAMUSCULAR | Status: AC
  Administered 2021-08-15: 0.5 mg via INTRAVENOUS
  Filled 2021-08-15: qty 1

## 2021-08-15 MED ORDER — SODIUM CHLORIDE (PF) 0.9 % IJ SOLN
INTRAMUSCULAR | Status: AC
Start: 2021-08-15 — End: ?
  Filled 2021-08-15: qty 20

## 2021-08-15 MED ORDER — POLYETHYLENE GLYCOL 3350 17 G PO PACK: 17.0000 g | PACK | Freq: Every day | ORAL | Status: DC | PRN

## 2021-08-15 MED ORDER — DEXTROSE 5 % IV SOLN
500.0000 mg | Freq: Four times a day (QID) | INTRAVENOUS | Status: DC | PRN
  Filled 2021-08-15: qty 5

## 2021-08-15 MED ORDER — ONDANSETRON HCL 4 MG/2ML IJ SOLN: 4.0000 mg | Freq: Four times a day (QID) | INTRAMUSCULAR | Status: DC | PRN

## 2021-08-15 MED ORDER — DEXAMETHASONE SODIUM PHOSPHATE 10 MG/ML IJ SOLN
INTRAMUSCULAR | Status: AC
  Filled 2021-08-15: qty 1

## 2021-08-15 MED ORDER — BUPIVACAINE HCL (PF) 0.5 % IJ SOLN
INTRAMUSCULAR | Status: AC
  Filled 2021-08-15: qty 30

## 2021-08-15 MED ORDER — SODIUM CHLORIDE (PF) 0.9 % IJ SOLN
INTRAMUSCULAR | Status: AC
Start: 2021-08-15 — End: ?
  Filled 2021-08-15: qty 50

## 2021-08-15 MED ORDER — BUPIVACAINE-EPINEPHRINE (PF) 0.5% -1:200000 IJ SOLN
INTRAMUSCULAR | Status: DC | PRN
  Administered 2021-08-15: 7 mL

## 2021-08-15 MED ORDER — PHENYLEPHRINE HCL-NACL 20-0.9 MG/250ML-% IV SOLN
INTRAVENOUS | Status: DC | PRN
  Administered 2021-08-15: 50 ug/min via INTRAVENOUS

## 2021-08-15 MED ORDER — METHOCARBAMOL 500 MG PO TABS
500.0000 mg | ORAL_TABLET | Freq: Four times a day (QID) | ORAL | Status: DC | PRN
  Administered 2021-08-15 – 2021-08-16 (×3): 500 mg via ORAL
  Filled 2021-08-15 (×4): qty 1

## 2021-08-15 MED ORDER — HYDROMORPHONE HCL 1 MG/ML IJ SOLN
0.5000 mg | INTRAMUSCULAR | Status: AC | PRN
  Administered 2021-08-15 (×3): 0.5 mg via INTRAVENOUS

## 2021-08-15 MED ORDER — MIDAZOLAM HCL 2 MG/2ML IJ SOLN
INTRAMUSCULAR | Status: AC
  Filled 2021-08-15: qty 2

## 2021-08-15 SURGICAL SUPPLY — 94 items
1.6mm Nitinol K-wire, 500mm, Sharp tip ×6 IMPLANT
ADH SKN CLS APL DERMABOND .7 (GAUZE/BANDAGES/DRESSINGS) ×1
AGENT HMST KT MTR STRL THRMB (HEMOSTASIS) ×1
APL PRP STRL LF DISP 70% ISPRP (MISCELLANEOUS) ×2
BRAINLAB DISPOSABLE TROCAR INSERT FOR PEDICLE ACCESS NEEDLE ×1 IMPLANT
BULB RESERV EVAC DRAIN JP 100C (MISCELLANEOUS) IMPLANT
BUR NEURO DRILL SOFT 3.0X3.8M (BURR) ×2 IMPLANT
CAP LOCKING SPINE (Cap) ×6 IMPLANT
CHLORAPREP W/TINT 26 (MISCELLANEOUS) ×4 IMPLANT
CNTNR SPEC 2.5X3XGRAD LEK (MISCELLANEOUS) ×1
CONT SPEC 4OZ STER OR WHT (MISCELLANEOUS) ×1
CONT SPEC 4OZ STRL OR WHT (MISCELLANEOUS) ×1
CONTAINER SPEC 2.5X3XGRAD LEK (MISCELLANEOUS) ×1 IMPLANT
COUNTER NEEDLE 20/40 LG (NEEDLE) ×2 IMPLANT
CUP MEDICINE 2OZ PLAST GRAD ST (MISCELLANEOUS) ×4 IMPLANT
DERMABOND ADVANCED (GAUZE/BANDAGES/DRESSINGS) ×1
DERMABOND ADVANCED .7 DNX12 (GAUZE/BANDAGES/DRESSINGS) ×1 IMPLANT
DRAIN CHANNEL JP 10F RND 20C F (MISCELLANEOUS) IMPLANT
DRAPE 3D C-ARM OEC (DRAPES) ×1 IMPLANT
DRAPE C ARM PK CFD 31 SPINE (DRAPES) ×1 IMPLANT
DRAPE C-ARMOR (DRAPES) ×2 IMPLANT
DRAPE INCISE IOBAN 66X45 STRL (DRAPES) ×2 IMPLANT
DRAPE LAPAROTOMY 100X77 ABD (DRAPES) ×2 IMPLANT
DRAPE MICROSCOPE SPINE 48X150 (DRAPES) ×1 IMPLANT
DRAPE SCAN PATIENT (DRAPES) ×3 IMPLANT
DRAPE SURG 17X11 SM STRL (DRAPES) ×8 IMPLANT
DRSG OPSITE POSTOP 4X6 (GAUZE/BANDAGES/DRESSINGS) ×2 IMPLANT
DRSG OPSITE POSTOP 4X8 (GAUZE/BANDAGES/DRESSINGS) IMPLANT
DRSG TEGADERM 4X4.75 (GAUZE/BANDAGES/DRESSINGS) IMPLANT
ELECT CAUTERY BLADE TIP 2.5 (TIP) ×2
ELECT EZSTD 165MM 6.5IN (MISCELLANEOUS) ×2
ELECT REM PT RETURN 9FT ADLT (ELECTROSURGICAL) ×2
ELECTRODE CAUTERY BLDE TIP 2.5 (TIP) ×1 IMPLANT
ELECTRODE EZSTD 165MM 6.5IN (MISCELLANEOUS) IMPLANT
ELECTRODE REM PT RTRN 9FT ADLT (ELECTROSURGICAL) ×1 IMPLANT
EX-PIN ORTHOLOCK NAV 4X150 (PIN) ×1 IMPLANT
FEE INTRAOP CADWELL SUPPLY NCS (MISCELLANEOUS) ×1 IMPLANT
FEE INTRAOP MONITOR IMPULS NCS (MISCELLANEOUS) IMPLANT
GAUZE 4X4 16PLY ~~LOC~~+RFID DBL (SPONGE) ×2 IMPLANT
GAUZE XEROFORM 4X4 STRL (GAUZE/BANDAGES/DRESSINGS) IMPLANT
GLOVE SURG SYN 6.5 ES PF (GLOVE) ×8 IMPLANT
GLOVE SURG SYN 6.5 PF PI (GLOVE) ×4 IMPLANT
GLOVE SURG SYN 8.5  E (GLOVE) ×3
GLOVE SURG SYN 8.5 E (GLOVE) ×3 IMPLANT
GLOVE SURG SYN 8.5 PF PI (GLOVE) ×3 IMPLANT
GLOVE SURG UNDER POLY LF SZ6.5 (GLOVE) ×6 IMPLANT
GOWN SRG LRG LVL 4 IMPRV REINF (GOWNS) ×3 IMPLANT
GOWN SRG XL LVL 3 NONREINFORCE (GOWNS) ×1 IMPLANT
GOWN STRL NON-REIN TWL XL LVL3 (GOWNS) ×2
GOWN STRL REIN LRG LVL4 (GOWNS) ×6
GRADUATE 1200CC STRL 31836 (MISCELLANEOUS) ×2 IMPLANT
GRAFT BONE PROTEIOS LRG 5CC (Orthopedic Implant) ×1 IMPLANT
HEMOVAC 400CC 10FR (MISCELLANEOUS) ×1 IMPLANT
INTERBODY SABLE 10X26 7-14 15D (Miscellaneous) ×2 IMPLANT
INTRAOP CADWELL SUPPLY FEE NCS (MISCELLANEOUS)
INTRAOP DISP SUPPLY FEE NCS (MISCELLANEOUS)
INTRAOP MONITOR FEE IMPULS NCS (MISCELLANEOUS)
INTRAOP MONITOR FEE IMPULSE (MISCELLANEOUS)
K-WIRE 1.6 NITINOL SHARP TIP (WIRE) ×12
KIT SPINAL PRONEVIEW (KITS) ×2 IMPLANT
KNIFE BAYONET SHORT DISCETOMY (MISCELLANEOUS) IMPLANT
KWIRE 1.6 NITINOL SHARP TIP (WIRE) IMPLANT
MANIFOLD NEPTUNE II (INSTRUMENTS) ×2 IMPLANT
MARKER SKIN DUAL TIP RULER LAB (MISCELLANEOUS) ×4 IMPLANT
MARKER SPHERE PSV REFLC 13MM (MARKER) ×14 IMPLANT
NDL SAFETY ECLIPSE 18X1.5 (NEEDLE) ×1 IMPLANT
NEEDLE HYPO 18GX1.5 SHARP (NEEDLE) ×2
NEEDLE HYPO 22GX1.5 SAFETY (NEEDLE) ×2 IMPLANT
NS IRRIG 1000ML POUR BTL (IV SOLUTION) ×2 IMPLANT
PACK LAMINECTOMY NEURO (CUSTOM PROCEDURE TRAY) ×2 IMPLANT
PENCIL ELECTRO HAND CTR (MISCELLANEOUS) ×1 IMPLANT
PUTTY DBX 10CC (Bone Implant) ×1 IMPLANT
ROD CREO MIS 5.5 CRVD TI 70 (Rod) ×1 IMPLANT
ROD SPINAL 5.5X75 (Rod) ×1 IMPLANT
SCREW CREO 6.5X45 FEN (Screw) ×4 IMPLANT
SCREW CREO FENESTRATED 7.5X40 (Screw) ×1 IMPLANT
SCREW CREO FENESTRATED 7.5X45 (Screw) ×1 IMPLANT
SCREW CREO MIS 30 TULIP (Screw) ×6 IMPLANT
SPONGE DRAIN TRACH 4X4 STRL 2S (GAUZE/BANDAGES/DRESSINGS) IMPLANT
STAPLER SKIN PROX 35W (STAPLE) IMPLANT
SURGIFLO W/THROMBIN 8M KIT (HEMOSTASIS) ×2 IMPLANT
SUT DVC VLOC 3-0 CL 6 P-12 (SUTURE) ×3 IMPLANT
SUT ETHILON 3 0 FSL (SUTURE) ×1 IMPLANT
SUT VIC AB 0 CT1 27 (SUTURE) ×2
SUT VIC AB 0 CT1 27XCR 8 STRN (SUTURE) ×1 IMPLANT
SUT VIC AB 2-0 CT1 18 (SUTURE) ×3 IMPLANT
SYR 20ML LL LF (SYRINGE) ×2 IMPLANT
SYR 30ML LL (SYRINGE) ×4 IMPLANT
SYR 3ML LL SCALE MARK (SYRINGE) ×2 IMPLANT
TOWEL OR 17X26 4PK STRL BLUE (TOWEL DISPOSABLE) ×6 IMPLANT
TRAY FOLEY SLVR 16FR LF STAT (SET/KITS/TRAYS/PACK) ×1 IMPLANT
TROCAR INSERT W/PEDICLE NDL (TROCAR) IMPLANT
TROCAR INSERT W/PEDICLE NDLE (TROCAR) ×1
TUBING CONNECTING 10 (TUBING) ×2 IMPLANT

## 2021-08-15 NOTE — Op Note (Signed)
Indications: Brittany Lara is a 81 yo female who presented with: ?M54.42 and G89.29-Chronic bilateral low back pain with left-sided sciatica ? ?She failed conservative management prompting surgical intervention. ? ?Findings: placement of TLIF cages ? ?Preoperative Diagnosis: M54.42 and G89.29-Chronic bilateral low back pain with left-sided sciatica ?Postoperative Diagnosis: same ? ? ?EBL: 200 ml ?IVF: see AR ml ?Drains: 1 placed ?Disposition: Extubated and Stable to PACU ?Complications: none ? ?No foley catheter was placed. ? ? ?Preoperative Note:  ? ?Risks of surgery discussed include: infection, bleeding, stroke, coma, death, paralysis, CSF leak, nerve/spinal cord injury, numbness, tingling, weakness, complex regional pain syndrome, recurrent stenosis and/or disc herniation, vascular injury, development of instability, neck/back pain, need for further surgery, persistent symptoms, development of deformity, and the risks of anesthesia. The patient understood these risks and agreed to proceed. ? ?Operative Note: ? ?1. Transforaminal Lumbar Interbody Fusion L4/5 and L5-S1 ?2. Posterolateral arthrodesis L4 to S1 ?3. Posterior segmental instrumentation L4 to S1 using Globus Creo ?4. Harvesting of autograft via the same incision ?5. Placement of a biomechanical device (Globus Rise) at L4/5 and L5-S1 for anterior arthrodesis ? ?The patient was brought to the Operating Room, intubated and turned into the prone position. All pressure points were checked and double checked. The patient was prepped and draped in the standard fashion. A full timeout was performed. Preoperative antibiotics were given.  ? ?The stereotactic C-arm was brought in.  The stereotactic array was placed in the left iliac crest.  Stereotactic images were acquired.  We then used the image guidance software to plan our pedicle screw tracks from L4-S1.   ? ?Bilateral [place] incisions were planned.  Using the image guided Jamshidi needle, L4-S1 pedicles were  cannulated bilaterally and K wires placed.  These were secured.  We then made an additional fascial incision close to the midline for performance of the transforaminal lumbar interbody fusion. ? ?At L4-5, Metrix tubes were placed over the left L4-5 facet joint.  The left L4-5 facet was entered in the infra articulating process of L4 removed.  The superior portion of the left L5 superior articulating process was also removed.  The ligamentum flavum was removed and the lateral aspect of the dura identified and protected.  The L4-5 disc was then entered.  The L4-5 disc was then removed until the endplates were identified on both sides of the disc base.  This was then prepared for arthrodesis.  An expandable globus rise interbody cage was then introduced and expanded under fluoroscopy.  It was backfilled with a mixture of allograft.  The site was then inspected and hemostasis achieved. ? ?The tube was removed and then repositioned at the L5-S1 disc base.  The same procedure was then performed with removal of the left L5 intra-articular in process and the superior portion of the left S1 superior articulating process to allow for performance of a L5-S1 transforaminal lumbar interbody fusion.  The globus rise cage was placed in similar fashion. ? ?After placement of cages, the screws were placed using an image guided screwdriver.  6.5 x 45 mm screws were placed at L4 and L5 bilaterally.  7.5 x 45 mm screw was placed on the left at S1 and 7.5 x 40 mm was placed on the right at S1.  We then brought in the C arm and confirmed safe screw placement. ? ?We then linked the tabs and secured the screws on the left side.  During final tightening on the right side, the screwdriver fractured in the  right S1 locking.  This was not removable.  Thus, we were unable to adjust the rod on the right side. ? ?After locking all locking caps, the tabs were removed and we then focused on closure.  Posterolateral arthrodesis from L4-S1 was  performed.  The wound was closed in layers with Vicryl and Dermabond on the skin. ? ? ?A subfascial drain was placed on the left. ? ? A sterile dressing was placed. ? ?The patient was then flipped supine and extubated with incident. All counts were correct times 2 at the end of the case. No immediate complications were noted. ? ? ?Cooper Render PA assisted in the procedure. ? ?Meade Maw MD ? ? ? ? ? ?

## 2021-08-15 NOTE — Anesthesia Procedure Notes (Signed)
Procedure Name: Intubation ?Date/Time: 08/15/2021 10:06 AM ?Performed by: Cammie Sickle, CRNA ?Pre-anesthesia Checklist: Patient identified, Patient being monitored, Timeout performed, Emergency Drugs available and Suction available ?Patient Re-evaluated:Patient Re-evaluated prior to induction ?Oxygen Delivery Method: Circle system utilized ?Preoxygenation: Pre-oxygenation with 100% oxygen ?Induction Type: IV induction ?Ventilation: Mask ventilation without difficulty ?Laryngoscope Size: 3 and McGraph ?Grade View: Grade I ?Tube type: Oral ?Tube size: 7.0 mm ?Number of attempts: 1 ?Airway Equipment and Method: Stylet ?Placement Confirmation: ETT inserted through vocal cords under direct vision, positive ETCO2 and breath sounds checked- equal and bilateral ?Secured at: 21 cm ?Tube secured with: Tape ?Dental Injury: Teeth and Oropharynx as per pre-operative assessment  ? ? ? ? ?

## 2021-08-15 NOTE — Anesthesia Preprocedure Evaluation (Addendum)
Anesthesia Evaluation  ?Patient identified by MRN, date of birth, ID band ?Patient awake ? ? ? ?Reviewed: ?Allergy & Precautions, NPO status , Patient's Chart, lab work & pertinent test results ? ?Airway ?Mallampati: II ? ?TM Distance: >3 FB ?Neck ROM: Full ? ? ? Dental ? ?(+) Upper Dentures, Lower Dentures ?  ?Pulmonary ?neg pulmonary ROS,  ?  ?Pulmonary exam normal ?breath sounds clear to auscultation ? ? ? ? ? ? Cardiovascular ?hypertension, Pt. on medications ?negative cardio ROS ?Normal cardiovascular exam ?Rhythm:Regular Rate:Normal ? ? ?  ?Neuro/Psych ?negative neurological ROS ? negative psych ROS  ? GI/Hepatic ?negative GI ROS, Neg liver ROS,   ?Endo/Other  ?negative endocrine ROS ? Renal/GU ?negative Renal ROS  ?negative genitourinary ?  ?Musculoskeletal ? ?(+) Arthritis ,  ? Abdominal ?(+) + obese,   ?Peds ? Hematology ?negative hematology ROS ?(+) Blood dyscrasia, anemia ,   ?Anesthesia Other Findings ?Past Medical History: ?No date: Anemia ?No date: Arthritis ?No date: Breast cyst ?No date: Cancer [facility]) ?    Comment:  skin ?No date: Dependent edema ?No date: Heart murmur ?No date: Hypertension ? ?Past Surgical History: ?No date: ABDOMINAL HYSTERECTOMY ?No date: ANAL FISSURE REPAIR ?yrs ago: BREAST EXCISIONAL BIOPSY; Bilateral ?    Comment:  benign ?No date: BREAST SURGERY; Bilateral ?    Comment:  bilateral breast biopsy (benign) ?07/11/2018: CATARACT EXTRACTION W/PHACO; Right ?    Comment:  Procedure: CATARACT EXTRACTION PHACO AND INTRAOCULAR  ?             LENS PLACEMENT ([place]) RIGHT EYE;  Surgeon: Marchia Meiers,  ?             MD;  Location: [facility] ORS;  Service: Ophthalmology;   ?             Laterality: Right;  [place] 01:13.7 ?CDE 9.93 ?Fluid pack Lot  ?             # [id number] ?08/08/2018: CATARACT EXTRACTION W/PHACO; Left ?    Comment:  Procedure: CATARACT EXTRACTION PHACO AND INTRAOCULAR  ?             LENS PLACEMENT ([place]) LEFT;  Surgeon: Marchia Meiers, MD;   ?              Location: [facility] ORS;  Service: Ophthalmology;  Laterality: ?             Left;  [place] 01:29.2CDE 11.56Fluid Pack Lot # [id number] ?No date: CHOLECYSTECTOMY ?No date: COLONOSCOPY ?12/04/2018: COLONOSCOPY WITH PROPOFOL; N/A ?    Comment:  Procedure: COLONOSCOPY WITH PROPOFOL;  Surgeon: Montgomery,  ?             Benay Pike, MD;  Location: [facility] ENDOSCOPY;  Service:  ?             Gastroenterology;  Laterality: N/A; ?No date: EYE SURGERY ?    Comment:  cataract extraction ?No date: JOINT REPLACEMENT; Bilateral ?    Comment:  TKR ?07/2001: left total knee replacement ? ?BMI   ? Body Mass Index: 38.33 kg/m?  ?  ? ? Reproductive/Obstetrics ?negative OB ROS ? ?  ? ? ? ? ? ? ? ? ? ? ? ? ? ?  ?  ? ? ? ? ? ? ?Anesthesia Physical ?Anesthesia Plan ? ?ASA: 2 ? ?Anesthesia Plan: General  ? ?Post-op Pain Management:   ? ?Induction: Intravenous ? ?PONV Risk Score and Plan: 1 and Ondansetron and Dexamethasone ? ?Airway Management Planned: Oral ETT ? ?  Additional Equipment:  ? ?Intra-op Plan:  ? ?Post-operative Plan: Extubation in OR ? ?Informed Consent: I have reviewed the patients History and Physical, chart, labs and discussed the procedure including the risks, benefits and alternatives for the proposed anesthesia with the patient or authorized representative who has indicated his/her understanding and acceptance.  ? ? ? ?Dental Advisory Given ? ?Plan Discussed with: CRNA and Surgeon ? ?Anesthesia Plan Comments:   ? ? ? ? ? ? ?Anesthesia Quick Evaluation ? ?

## 2021-08-15 NOTE — Progress Notes (Signed)
Orthopedic Tech Progress Note ?Patient Details:  ?Brittany Lara ?Jan 12, 1941 ?[id number] ? ?Called in order to [employer] for an [place] ? ?Patient ID: Brittany Lara, female   DOB: 1941/01/04, 81 y.o.   MRN: [id number] ? ?Brittany Lara ?08/15/2021, 6:47 PM ? ?

## 2021-08-15 NOTE — Progress Notes (Signed)
Contacted Ortho Tech for [employer] Lumbar brace ?

## 2021-08-15 NOTE — H&P (Signed)
History of Present Illness: 08/15/2021 [name] continues to have back and leg pain.  She presents today for surgery.  07/12/2021  [name] returns to see me. She has worsening pain. She has tried physical therapy and injections without improvements.  05/27/2021 [name] is here today with left leg pain. She has been having pain for considerable period of time. She is able to get about her day-to-day life as long she takes her pain medication. She is currently taking hydrocodone once in the morning once in the evening and tramadol at lunchtime.  She has had several years of back pain. Her pain is worse with laying in bed and sitting. Rest helps. She has been very active and just recently started physical therapy.  Conservative measures: yoga, walking, stretching, exercise classes Physical therapy: has participated in 1 visit (05/17/21) Multimodal medical therapy including regular antiinflammatories: tramadol, diclofenac, gabapentin, etodolac, tylenol, advil, norco Injections: has received epidural steroid injections 04/18/21: Left L3 and L4 medial branch and L5 dorsal ramus blocks by Dr Alba Destine (no significant relief) 04/01/21: Left L3 and L4 medial branch and L5 dorsal ramus blocks by Dr Alba Destine (80% relief) 03/11/21: Left L4-5 TFESI by Dr Alba Destine (moderate relief) 02/21/21: Left L5-S1 TFESI by Dr Alba Destine (excellent relief, but temporary) 01/26/21: Right L3 and L4 medial branch and L5 dorsal ramus radiofrequency neurotomy by Dr Sharlet Salina (90% improvement) 12/31/20: Right L3 and L4 medial branch and L5 dorsal ramus blocks by Dr Alba Destine 12/17/20: Right L3 and L4 medial branch and L5 dorsal ramus blocks by Dr Alba Destine 11/29/20: Right sacroiliac joint injection by Dr Alba Destine (60% relief) 10/25/20: Left sacroiliac joint injection by Dr Alba Destine (97% improvement) 10/04/20: Bilateral L4-5 TFESI by Dr Alba Destine (90% relief of right side) 04/26/20: Bilateral L4-5 TFESI by Dr Alba Destine (90%  improvement) 03/12/20: Left L4-5 TFESI by Dr Alba Destine (90% improvement) 02/27/20: Left L4-5 TFESI by Dr Alba Destine 10/15/19: Bilateral L4-5 TFESI by Dr Alba Destine 09/24/19: Bilateral L4-5 TFESI by Dr Alba Destine Has also received piriformis injections  09/04/2019 Ms. Sahmya Arai is here today with a chief complaint of low back pain, abdominal pain, burning in both lateral thighs.  She has been having back pain for several years but worst over the last year or so. She describes dull back pain which is made worse by sitting up and nothing really helps. She reports pain is bad as 5 out of 10. She has no weakness or radicular pain. She is tried stretching and exercising on her own. She is not tried physical therapy. She has not tried injections.  Conservative measures: yoga, walking, stretching, exercise classes Physical therapy: has not tried Multimodal medical therapy including regular antiinflammatories: tramadol, diclofenac, gabapentin, etodolac, tylenol, advil Injections: has not tried epidural steroid injections Has tried piriformis trigger point shots  Past Surgery: denies  Brittany Lara has no symptoms of cervical myelopathy.  The symptoms are causing a significant impact on the patient's life.   Review of Systems:  A 10 point review of systems is negative, except for the pertinent positives and negatives detailed in the HPI.  Past Medical History: Past Medical History:  Diagnosis Date   Anemia   Breast cyst   Cancer (CMS-HCC)  basal and squamous cell carcinoma one on right shoulder and 2 on her back   Dependent edema  postmenopausal   Hypertension   Osteoarthritis   Seasonal allergies   Past Surgical History: Past Surgical History:  Procedure Laterality Date   HYSTERECTOMY 1992   LEFT TOTAL KNEE  REPLACEMENT 07/2001   COLONOSCOPY 01/20/2005   COLONOSCOPY 07/17/2008   RIGHT TOTAL KNEE REPLACEMENT CEMENTED 06/23/2010   COLONOSCOPY 12/04/2018  Diverticulosis/No repeat due to  age/TKT   Anal Fissure Repair   Bilateral Breast Biopsy  Benign   CATARACT EXTRACTION Right   CHOLECYSTECTOMY   skin cancer removed  summer 2016   No Known Allergies  Current Meds  Medication Sig   aspirin EC 81 MG tablet Take 81 mg by mouth daily.   cetirizine (ZYRTEC) 10 MG tablet Take 10 mg by mouth daily as needed for allergies. (Patient not taking: Reported on 08/01/2021)   Cholecalciferol (VITAMIN D3) 125 MCG (5000 UT) CAPS Take 5,000 Units by mouth every Sunday.    Cyanocobalamin (VITAMIN B-12) 5000 MCG TBDP Take 5,000 mcg by mouth daily.   diclofenac (VOLTAREN) 75 MG EC tablet Take 75 mg by mouth 2 (two) times daily.   furosemide (LASIX) 20 MG tablet Take 20 mg by mouth daily.   gabapentin (NEURONTIN) 300 MG capsule Take 600 mg by mouth at bedtime.   KLOR-CON M10 10 MEQ tablet Take 10 mEq by mouth daily.    lisinopril (ZESTRIL) 40 MG tablet Take 40 mg by mouth daily.   traMADol (ULTRAM) 50 MG tablet Take 100 mg by mouth at bedtime.    Social History: Social History   Tobacco Use   Smoking status: Never   Smokeless tobacco: Never  Vaping Use   Vaping Use: Never used  Substance Use Topics   Alcohol use: No   Drug use: No   Family Medical History: Family History  Problem Relation Age of Onset   Myocardial Infarction (Heart attack) Mother   Physical Examination:  Vitals:   08/15/21 0846  BP: (!) 150/94  Pulse: 74  Resp: 18  Temp: (!) 97.2 F (36.2 C)  SpO2: 100%    General: Patient is well developed, well nourished, calm, collected, and in no apparent distress. Attention to examination is appropriate.  Psychiatric: Patient is non-anxious.  Head: Pupils equal, round, and reactive to light.  ENT: Oral mucosa appears well hydrated.  Neck: Supple. Full range of motion.  Respiratory: Patient is breathing without any difficulty.  Extremities: No edema.  Vascular: Palpable dorsal pedal pulses.  Skin: On exposed skin, there are no abnormal skin  lesions.  NEUROLOGICAL:   Awake, alert, oriented to person, place, and time. Speech is clear and fluent. Fund of knowledge is appropriate.   Cranial Nerves: Pupils equal round and reactive to light. Facial tone is symmetric. Facial sensation is symmetric. Shoulder shrug is symmetric. Tongue protrusion is midline. There is no pronator drift.  ROM of spine: full. Palpation of spine: non tender.   Strength: Side Biceps Triceps Deltoid Interossei Grip Wrist Ext. Wrist Flex.  R '5 5 5 5 5 5 5  '$ L '5 5 5 5 5 5 5   '$ Side Iliopsoas Quads Hamstring PF DF EHL  R '5 5 5 5 5 5  '$ L '5 5 5 5 5 5   '$ Reflexes are 1+ and symmetric at the biceps, triceps, brachioradialis, patella and achilles. Hoffman's is absent. Clonus is not present. Toes are down-going.  Bilateral upper and lower extremity sensation is intact to light touch.  Gait is slowed and antalgic. Medical Decision Making  Imaging: MRI L spine 09/01/2019 IMPRESSION: 1. Lumbar spondylosis and degenerative disc disease causing moderate impingement at L1-L2 and mild impingement at L3-4, L4-5, and L5-S1. 2. Levoconvex lumbar scoliosis with rotary component. 3. Bilateral facet effusions at  L5-S1.  Electronically Signed  By: Van Clines M.D.  On: 09/01/2019 10:11  MRI L spine 01/24/2021 IMPRESSION:  1. Stable degenerative changes of the lumbar spine with multilevel  neural foraminal narrowing, moderate to severe on the right at L1-2  and moderate on the left at L4-5.  2. No high-grade spinal canal stenosis.   Electronically Signed    By: Pedro Earls M.D.    On: 01/25/2021 08:32  I have personally reviewed the images and agree with the above interpretation.  Assessment and Plan: Ms. Rolfson is a pleasant 81 y.o. female with acquired scoliosis, lumbar radiculopathy, and low back pain. She does have facet effusions at L5-S1 bilaterally and extensive lumbar spondylosis. She has foraminal stenosis on the left at L4-5 and  on the left at L5-S1 that could be contributing to her pain.   At this point, back pain and leg pain are a primary consideration. She has failed conservative management.  Given her level of back pain and the level of facet effusions at L5-S1 in addition to prominent foraminal compression of L4-5 and L5-S1 on the left, we will proceed with L4-S1 transforaminal lumbar interbody fusion.   Meade Maw MD, [facility] Department of Neurosurgery

## 2021-08-15 NOTE — Transfer of Care (Signed)
Immediate Anesthesia Transfer of Care Note ? ?Patient: Brittany Lara ? ?Procedure(s) Performed: L4-S1 TRANSFORAMINAL LUMBAR INTERBODY FUSION (Spine Lumbar) ?APPLICATION OF INTRAOPERATIVE CT SCAN (Spine Lumbar) ? ?Patient Location: PACU ? ?Anesthesia Type:General ? ?Level of Consciousness: drowsy ? ?Airway & Oxygen Therapy: Patient Spontanous Breathing and Patient connected to face mask oxygen ? ?Post-op Assessment: Report given to RN and Post -op Vital signs reviewed and stable ? ?Post vital signs: Reviewed and stable ? ?Last Vitals:  ?Vitals Value Taken Time  ?BP 128/67 08/15/21 1450  ?Temp 35.9   ?Pulse 79 08/15/21 1453  ?Resp 20 08/15/21 1453  ?SpO2 100 % 08/15/21 1453  ?Vitals shown include unvalidated device data. ? ?Last Pain:  ?Vitals:  ? 08/15/21 0846  ?TempSrc: Temporal  ?PainSc: 0-No pain  ?   ? ?  ? ?Complications: No notable events documented. ?

## 2021-08-15 NOTE — Progress Notes (Signed)
Pharmacy Antibiotic Note ? ?Brittany Lara is a 81 y.o. female admitted on (Not on file) with surgical prophylaxis.  Pharmacy has been consulted for Cefazolin, Vancomycin dosing. ? ?Plan: ?TBW = 101.3 kg  ? ?Cefazolin 2 gm IV X 1 ordered 60 min pre-op on 3/6 @ 0500. ? ?Vancomycin 1500 mg (15 mg/kg) IV X 1 ordered 60 min pre-op on 3/6 @ 0500. ? ?  ? ?No data recorded. ? ?No results for input(s): WBC, CREATININE, LATICACIDVEN, VANCOTROUGH, VANCOPEAK, VANCORANDOM, GENTTROUGH, GENTPEAK, GENTRANDOM, TOBRATROUGH, TOBRAPEAK, TOBRARND, AMIKACINPEAK, AMIKACINTROU, AMIKACIN in the last 168 hours.  ?CrCl cannot be calculated (No successful lab value found.).   ? ?No Known Allergies ? ?Antimicrobials this admission: ?  >>  ?  >>  ? ?Dose adjustments this admission: ? ? ?Microbiology results: ? BCx:  ? UCx:   ? Sputum:   ? MRSA PCR:  ? ?Thank you for allowing pharmacy to be a part of this patient?s care. ? ?Darlene Brozowski D ?08/15/2021 3:31 AM ? ?

## 2021-08-16 ENCOUNTER — Inpatient Hospital Stay: Payer: PPO

## 2021-08-16 MED ORDER — SENNA 8.6 MG PO TABS: 1.0000 | ORAL_TABLET | Freq: Two times a day (BID) | ORAL | 0 refills | Status: DC

## 2021-08-16 MED ORDER — METHOCARBAMOL 500 MG PO TABS: 500.0000 mg | ORAL_TABLET | Freq: Three times a day (TID) | ORAL | 0 refills | Status: DC | PRN

## 2021-08-16 MED ORDER — OXYCODONE HCL 5 MG PO TABS: 5.0000 mg | ORAL_TABLET | ORAL | 0 refills | Status: DC | PRN

## 2021-08-16 NOTE — Discharge Instructions (Addendum)
NEUROSURGERY DISCHARGE INSTRUCTIONS ? ?Admission diagnosis: S/P spinal fusion [Z98.1] ? ?Operative procedure: L4-5 and L5-S1 TFESI, L4-S1 PSF ? ?What to do after you leave the hospital: ? ?Recommended diet: regular diet. Increase protein intake to promote wound healing. ? ?Recommended activity: no lifting, driving, or strenuous exercise for 4 weeks . You should walk multiple times per day ? ?Special Instructions ? ?No straining, no heavy lifting > 10lbs x 4 weeks.  Keep incision area clean and dry. May shower in 2 days. No baths or pools for 6 weeks.  ?Please remove dressing tomorrow, no need to apply a bandage afterwards ? ?You have no sutures to remove, the skin is closed with adhesive ? ?Please take pain medications as directed. Take a stool softener if on pain medications ? ? ?Please Report any of the following: ?Nausea or Vomiting, Temperature is greater than 101.91F (38.1C) degrees, Dizziness, Abdominal Pain, Difficulty Breathing or Shortness of Breath, Inability to Eat, drink Fluids, or Take medications, Bleeding, swelling, or drainage from surgical incision sites, New numbness or weakness, and Bowel or bladder dysfunction to the neurosurgeon on call at [phone] ? ?Additional Follow up appointments ?Please follow up with Cooper Render PA-C in [place] [facility] as scheduled in 2-3 weeks ? ? ?Please see below for scheduled appointments: ? ?No future appointments. ? ?  ?

## 2021-08-16 NOTE — Evaluation (Addendum)
Physical Therapy Evaluation ?Patient Details ?Name: Brittany Lara ?MRN: [id number] ?DOB: 1941-05-31 ?Today's Date: 08/16/2021 ? ?History of Present Illness ? Patient is a 81 year old female with chronic bilateral low back pain with left-sided sciatica s/p  s/p L4-5 and L5-S1 TFESI, L4-S1 PSF  ?Clinical Impression ? Patient is agreeable to PT evaluation. She is sitting up in the chair on arrival to room with spouse present. Brace was adjusted prior to walking and reviewed need to wear  brace with ambulation. The patient did not require physical assistance for transfers. She ambulated with and without rolling walker with improvement in gait pattern and balance with use of rolling walker. Educated patient to use with ambulation for safety and fall prevention. Reviewed general spine precautions following surgery and logroll technique. Blood pressure increased from 94/54 in sitting to 106/90 in standing with no dizziness reported with activity. The patient is eager to be discharged home today if possible.  Recommend to continue PT while in the hospital to maximize independence in preparation for discharge home with family support.  ?   ? ?Recommendations for follow up therapy are one component of a multi-disciplinary discharge planning process, led by the attending physician.  Recommendations may be updated based on patient status, additional functional criteria and insurance authorization. ? ?Follow Up Recommendations Follow physician's recommendations for discharge plan and follow up therapies ? ?  ?Assistance Recommended at Discharge Set up Supervision/Assistance  ?Patient can return home with the following ? Assist for transportation;Help with stairs or ramp for entrance ? ?  ?Equipment Recommendations Rolling walker (2 wheels)  ?Recommendations for Other Services ?    ?  ?Functional Status Assessment Patient has had a recent decline in their functional status and demonstrates the ability to make significant  improvements in function in a reasonable and predictable amount of time.  ? ?  ?Precautions / Restrictions Precautions ?Precautions: Back ?Precaution Booklet Issued: No ?Precaution Comments: patient reports she has a handout for logroll technique at home already ?Required Braces or Orthoses: Spinal Brace ?Spinal Brace: Applied in supine position ?Restrictions ?Weight Bearing Restrictions: No  ? ?  ? ?Mobility ? Bed Mobility ?  ?  ?  ?  ?  ?  ?  ?General bed mobility comments: not assessed as patient sitting up on arrival and post session. patient educated on logroll technique and verbalized understanding. ?  ? ?Transfers ?Overall transfer level: Needs assistance ?Equipment used: Rolling walker (2 wheels) ?Transfers: Sit to/from Stand ?Sit to Stand: Supervision ?  ?  ?  ?  ?  ?General transfer comment: no physical assistance required for transfers. verbal cues for safety. brace adjusted in standing position for appropriate fit. ?  ? ?Ambulation/Gait ?Ambulation/Gait assistance: Supervision ?Gait Distance (Feet): 100 Feet ?Assistive device:  (with and without rolling walker) ?Gait Pattern/deviations: Step-through pattern, Decreased stride length ?Gait velocity: decreased ?  ?  ?General Gait Details: patient ambulated in the room with rolling walker with verbal cues for technique and safety. patient ambulated in hallway without assistive device and frequently reaching for railing for support with decreased step length compared to using rolling walker. educated and encourgaed patient to use rolling walker for improved gait pattern and safety ? ?Stairs ?Stairs:  (not assessed. patient does have a ramp in place at home) ?  ?  ?  ?  ? ?Wheelchair Mobility ?  ? ?Modified Rankin (Stroke Patients Only) ?  ? ?  ? ?Balance Overall balance assessment: Needs assistance ?Sitting-balance support: Feet supported ?Sitting  balance-Leahy Scale: Normal ?  ?  ?Standing balance support: Bilateral upper extremity supported, During  functional activity, Single extremity supported ?Standing balance-Leahy Scale: Fair ?Standing balance comment: no loss of balance with single UE support in standing ?  ?  ?  ?  ?  ?  ?  ?  ?  ?  ?  ?   ? ? ? ?Pertinent Vitals/Pain Pain Assessment ?Pain Assessment: Faces ?Faces Pain Scale: Hurts a little bit ?Pain Location: "under the rib cage" ?Pain Descriptors / Indicators: Discomfort ?Pain Intervention(s): Limited activity within patient's tolerance, Premedicated before session  ? ? ?Home Living Family/patient expects to be discharged to:: Private residence ?Living Arrangements: Spouse/significant other ?Available Help at Discharge: Family;Available 24 hours/day ?Type of Home: Mobile home ?Home Access: Stairs to enter ?  ?Entrance Stairs-Number of Steps: ramp ?  ?Home Layout: One level ?Home Equipment: Kasandra Knudsen - single point ?   ?  ?Prior Function Prior Level of Function : Independent/Modified Independent ?  ?  ?  ?  ?  ?  ?Mobility Comments: independent, works one day a week ?ADLs Comments: independent ?  ? ? ?Hand Dominance  ? Dominant Hand: Right ? ?  ?Extremity/Trunk Assessment  ? Upper Extremity Assessment ?Upper Extremity Assessment: Overall WFL for tasks assessed ?  ? ?Lower Extremity Assessment ?Lower Extremity Assessment: Overall WFL for tasks assessed ?  ? ?Cervical / Trunk Assessment ?Cervical / Trunk Assessment: Back Surgery  ?Communication  ? Communication: No difficulties  ?Cognition Arousal/Alertness: Awake/alert ?Behavior During Therapy: [facility] for tasks assessed/performed ?Overall Cognitive Status: Within Functional Limits for tasks assessed ?  ?  ?  ?  ?  ?  ?  ?  ?  ?  ?  ?  ?  ?  ?  ?  ?General Comments: patient is alert and able to follow all commands without difficulty ?  ?  ? ?  ?General Comments General comments (skin integrity, edema, etc.): blood pressure measured at 94/54 in sitting and 106/90 in standing. no dizziness is reported with activity in standing ? ?  ?Exercises    ? ?Assessment/Plan   ?  ?PT Assessment Patient needs continued PT services  ?PT Problem List Decreased activity tolerance;Decreased balance;Decreased mobility;Decreased safety awareness;Decreased knowledge of use of DME ? ?   ?  ?PT Treatment Interventions DME instruction;Gait training;Stair training;Functional mobility training;Therapeutic activities;Therapeutic exercise;Balance training;Neuromuscular re-education;Patient/family education   ? ?PT Goals (Current goals can be found in the Care Plan section)  ?Acute Rehab PT Goals ?Patient Stated Goal: to go home ?PT Goal Formulation: With patient ?Time For Goal Achievement: 08/30/21 ?Potential to Achieve Goals: Good ? ?  ?Frequency 7X/week ?  ? ? ?Co-evaluation   ?  ?  ?  ?  ? ? ?  ?AM-PAC PT "6 Clicks" Mobility  ?Outcome Measure Help needed turning from your back to your side while in a flat bed without using bedrails?: None ?Help needed moving from lying on your back to sitting on the side of a flat bed without using bedrails?: A Little ?Help needed moving to and from a bed to a chair (including a wheelchair)?: A Little ?Help needed standing up from a chair using your arms (e.g., wheelchair or bedside chair)?: A Little ?Help needed to walk in hospital room?: A Little ?Help needed climbing 3-5 steps with a railing? : A Little ?6 Click Score: 19 ? ?  ?End of Session Equipment Utilized During Treatment: Back brace ?Activity Tolerance: Patient tolerated treatment well ?Patient left: in  chair;with call bell/phone within reach;with family/visitor present ?Nurse Communication: Mobility status ?PT Visit Diagnosis: Other abnormalities of gait and mobility (R26.89) ?  ? ?Time: 8979-1504 ?PT Time Calculation (min) (ACUTE ONLY): 28 min ? ? ?Charges:   PT Evaluation ?$PT Eval Low Complexity: 1 Low ?PT Treatments ?$Gait Training: 8-22 mins ?  ?   ?Minna Merritts, PT, MPT ? ? ?Percell Locus ?08/16/2021, 12:05 PM ? ?

## 2021-08-16 NOTE — Anesthesia Postprocedure Evaluation (Signed)
Anesthesia Post Note ? ?Patient: Brittany Lara ? ?Procedure(s) Performed: L4-S1 TRANSFORAMINAL LUMBAR INTERBODY FUSION (Spine Lumbar) ?APPLICATION OF INTRAOPERATIVE CT SCAN (Spine Lumbar) ? ?Patient location during evaluation: PACU ?Anesthesia Type: General ?Level of consciousness: awake and awake and alert ?Pain management: satisfactory to patient ?Vital Signs Assessment: post-procedure vital signs reviewed and stable ?Respiratory status: spontaneous breathing and nonlabored ventilation ?Cardiovascular status: stable ?Anesthetic complications: no ? ? ?No notable events documented. ? ? ?Last Vitals:  ?Vitals:  ? 08/16/21 0456 08/16/21 0816  ?BP: (!) 100/54 (!) 97/44  ?Pulse: 71 66  ?Resp: 16 14  ?Temp: 37.3 ?C 36.9 ?C  ?SpO2: 93% 90%  ?  ?Last Pain:  ?Vitals:  ? 08/16/21 0643  ?TempSrc:   ?PainSc: Asleep  ? ? ?  ?  ?  ?  ?  ?  ? ?Brittany Lara,Brittany Lara ? ? ? ? ?

## 2021-08-16 NOTE — Plan of Care (Signed)
?  Problem: Education: ?Goal: Knowledge of General Education information will improve ?Description: Including pain rating scale, medication(s)/side effects and non-pharmacologic comfort measures ?Outcome: Progressing ?  ?Problem: Health Behavior/Discharge Planning: ?Goal: Ability to manage health-related needs will improve ?Outcome: Progressing ?  ?Problem: Clinical Measurements: ?Goal: Ability to maintain clinical measurements within normal limits will improve ?Outcome: Progressing ?Goal: Will remain free from infection ?Outcome: Progressing ?Goal: Diagnostic test results will improve ?Outcome: Progressing ?Goal: Respiratory complications will improve ?Outcome: Progressing ?Goal: Cardiovascular complication will be avoided ?Outcome: Progressing ?  ?Problem: Activity: ?Goal: Risk for activity intolerance will decrease ?Outcome: Progressing ?  ?Problem: Nutrition: ?Goal: Adequate nutrition will be maintained ?Outcome: Progressing ?  ?Problem: Coping: ?Goal: Level of anxiety will decrease ?Outcome: Progressing ?  ?Problem: Pain Managment: ?Goal: General experience of comfort will improve ?Outcome: Progressing ?  ?Problem: Skin Integrity: ?Goal: Risk for impaired skin integrity will decrease ?Outcome: Progressing ?  ?Problem: Education: ?Goal: Ability to verbalize activity precautions or restrictions will improve ?Outcome: Progressing ?Goal: Knowledge of the prescribed therapeutic regimen will improve ?Outcome: Progressing ?Goal: Understanding of discharge needs will improve ?Outcome: Progressing ?  ?Problem: Activity: ?Goal: Ability to avoid complications of mobility impairment will improve ?Outcome: Progressing ?Goal: Ability to tolerate increased activity will improve ?Outcome: Progressing ?Goal: Will remain free from falls ?Outcome: Progressing ?  ?Problem: Bowel/Gastric: ?Goal: Gastrointestinal status for postoperative course will improve ?Outcome: Progressing ?  ?Problem: Pain Management: ?Goal: Pain level will  decrease ?Outcome: Progressing ?  ?Problem: Health Behavior/Discharge Planning: ?Goal: Identification of resources available to assist in meeting health care needs will improve ?Outcome: Progressing ?  ?Problem: Bladder/Genitourinary: ?Goal: Urinary functional status for postoperative course will improve ?Outcome: Progressing ?  ?

## 2021-08-16 NOTE — Evaluation (Signed)
Occupational Therapy Evaluation Patient Details Name: Brittany Lara MRN: [id number] DOB: 02-Dec-1940 Today's Date: 08/16/2021   History of Present Illness Pt is a  81 y.o presenting with chronic bilateral low back pain and left-sided sciatica s/p L4-5 and L5-S1 TFESI, L4-S1 PSF on 08/15/21.   Clinical Impression   Pt seen for OT evaluation this date, POD#1 from above surgery. Prior to hospital admission, pt was independent with mobility, ADL, and IADL.  Pt lives with spouse in a single family home with a ramp to enter, spouse able to provide 24/7 assist/support as needed for pt. Pt requires MAX A for LB dressing, SET UP for UB grooming, dressing tasks. Pt requires SUP for bed mobility with vcs for technique, CGA for ambulation, transfer to bedside chair. Brace donned at edge of bed with MOD A. Pt educated in precautions, self care skills, AE, and home/routines modifications to maximize safety and functional independence while minimizing falls risk and maintaining precautions. Pt and husband verbalized understanding of all education/training provided. Able to return demonstration safe techniques while maintaining precautions throughout. Handout provided to support recall and carry over of learned precautions/techniques for bed mobility, functional transfers, and self care skills. No further OT recommended following discharge, will continue to follow acutely. Pt is left in chair, NAD, all needs met. RN notified of low BP reading, pt is asymptomatic.   BP in supine 106/63, 86/36 seated in chair with legs up, after 3 minutes 94/48.   Recommendations for follow up therapy are one component of a multi-disciplinary discharge planning process, led by the attending physician.  Recommendations may be updated based on patient status, additional functional criteria and insurance authorization.   Follow Up Recommendations  No OT follow up    Assistance Recommended at Discharge Intermittent  Supervision/Assistance  Patient can return home with the following A little help with walking and/or transfers;Assistance with cooking/housework;Assist for transportation;A little help with bathing/dressing/bathroom;Help with stairs or ramp for entrance    Functional Status Assessment  Patient has had a recent decline in their functional status and demonstrates the ability to make significant improvements in function in a reasonable and predictable amount of time.  Equipment Recommendations  BSC/3in1;Tub/shower seat    Recommendations for Other Services       Precautions / Restrictions Precautions Precautions: Back Required Braces or Orthoses: Spinal Brace Spinal Brace: Applied in sitting position;Lumbar corset (aspen lumbar brace) Restrictions Weight Bearing Restrictions: No      Mobility Bed Mobility Overal bed mobility: Needs Assistance Bed Mobility: Supine to Sit     Supine to sit: Supervision     General bed mobility comments: frequent vcs for approrpiate body mechanics, bed flat to simulate home    Transfers Overall transfer level: Needs assistance Equipment used: Rolling walker (2 wheels) Transfers: Sit to/from Stand Sit to Stand: Min assist                  Balance Overall balance assessment: Needs assistance Sitting-balance support: Feet supported Sitting balance-Leahy Scale: Normal Sitting balance - Comments: good static and dynamic sitting balance   Standing balance support: Bilateral upper extremity supported, During functional activity Standing balance-Leahy Scale: Fair                             ADL either performed or assessed with clinical judgement   ADL Overall ADL's : Needs assistance/impaired Eating/Feeding: Set up;Sitting   Grooming: Wash/dry hands;Wash/dry face;Oral care;Sitting;Set up   Upper  Body Bathing: Set up;Sitting Upper Body Bathing Details (indicate cue type and reason): anticipated         Lower Body  Dressing: Maximal assistance;Bed level Lower Body Dressing Details (indicate cue type and reason): socks, educated on use of AE, reports husband will assist Toilet Transfer: Min guard;Ambulation;Rolling walker (2 wheels) Toilet Transfer Details (indicate cue type and reason): simulated to bedside chair         Functional mobility during ADLs: Min guard;Rolling walker (2 wheels) General ADL Comments: MOD A for donning brace; pt with low BP after sitting in chair therefore additional amb to bathroom NT on this date; pt is asymptomatic     Vision   Vision Assessment?: No apparent visual deficits     Perception     Praxis      Pertinent Vitals/Pain Pain Assessment Pain Assessment: No/denies pain     Hand Dominance Right   Extremity/Trunk Assessment Upper Extremity Assessment Upper Extremity Assessment: Overall WFL for tasks assessed (strength NT due to precautions, appears grossly [facility] for functional tasks within precautions; no report of sensation changes)   Lower Extremity Assessment Lower Extremity Assessment: Overall WFL for tasks assessed   Cervical / Trunk Assessment Cervical / Trunk Assessment: Back Surgery   Communication Communication Communication: No difficulties   Cognition Arousal/Alertness: Awake/alert Behavior During Therapy: WFL for tasks assessed/performed Overall Cognitive Status: Within Functional Limits for tasks assessed                                 General Comments: alert and oriented x4     General Comments  BP in supine 106/63, 86/36 seated in chair with legs up, after 3 minutes 94/48; pt is asymptomatic throughout. RN aware.    Exercises     Shoulder Instructions      Home Living Family/patient expects to be discharged to:: Private residence Living Arrangements: Spouse/significant other Available Help at Discharge: Family;Available 24 hours/day Type of Home: Mobile home Home Access: Stairs to enter Entrance  Stairs-Number of Steps: ramp   Home Layout: One level     Bathroom Shower/Tub: Occupational psychologist: Handicapped height Bathroom Accessibility: Yes   Home Equipment: Harper - single point          Prior Functioning/Environment Prior Level of Function : Independent/Modified Independent               ADLs Comments: indep in all ADL/IADL        OT Problem List: Impaired balance (sitting and/or standing);Decreased knowledge of precautions;Decreased activity tolerance;Decreased knowledge of use of DME or AE      OT Treatment/Interventions: Self-care/ADL training;DME and/or AE instruction;Therapeutic activities;Therapeutic exercise;Patient/family education    OT Goals(Current goals can be found in the care plan section) Acute Rehab OT Goals Patient Stated Goal: go home OT Goal Formulation: With patient Time For Goal Achievement: 08/30/21 Potential to Achieve Goals: Good ADL Goals Pt Will Perform Grooming: with modified independence Pt Will Perform Upper Body Dressing: with modified independence Pt Will Perform Lower Body Dressing: with modified independence;with adaptive equipment Pt Will Transfer to Toilet: with modified independence Pt Will Perform Toileting - Clothing Manipulation and hygiene: with modified independence  OT Frequency: Min 2X/week    Co-evaluation              AM-PAC OT "6 Clicks" Daily Activity     Outcome Measure Help from another person eating meals?: None Help from  another person taking care of personal grooming?: None Help from another person toileting, which includes using toliet, bedpan, or urinal?: A Little Help from another person bathing (including washing, rinsing, drying)?: A Little Help from another person to put on and taking off regular upper body clothing?: None Help from another person to put on and taking off regular lower body clothing?: A Lot 6 Click Score: 20   End of Session Equipment Utilized During  Treatment: Rolling walker (2 wheels) Nurse Communication: Mobility status;Other (comment) (low BP)  Activity Tolerance: Patient tolerated treatment well Patient left: in chair;with call bell/phone within reach;with chair alarm set;with family/visitor present  OT Visit Diagnosis: Unsteadiness on feet (R26.81)                Time: 3383-2919 OT Time Calculation (min): 34 min Charges:  OT General Charges $OT Visit: 1 Visit OT Evaluation $OT Eval Low Complexity: 1 Low OT Treatments $Self Care/Home Management : 8-22 mins  Shanon Payor, OTD OTR/L  08/16/21, 10:26 AM

## 2021-08-16 NOTE — Progress Notes (Signed)
? ?   Attending Progress Note ? ?History: Brittany Lara is s/p L4-5 and L5-S1 TFESI, L4-S1 PSF ? ?POD1: NAEO. Pt denies leg pain. Feels pain is well controlled on current medications  ? ?Physical Exam: ?Vitals:  ? 08/16/21 0456 08/16/21 0816  ?BP: (!) 100/54 (!) 97/44  ?Pulse: 71 66  ?Resp: 16 14  ?Temp: 99.2 ?F (37.3 ?C) 98.4 ?F (36.9 ?C)  ?SpO2: 93% 90%  ? ? ?AA Ox3 ?CNI ?Strength:5/5 throughout BLE  ? ?Data: ? ?Recent Labs  ?Lab 08/15/21 ?0626 08/15/21 ?1811  ?NA 137  --   ?K 4.4  --   ?CL 100  --   ?BUN 19  --   ?CREATININE 0.90 0.75  ?GLUCOSE 107*  --   ? ?No results for input(s): AST, ALT, ALKPHOS in the last 168 hours. ? ?Invalid input(s): TBILI  ? Recent Labs  ?Lab 08/15/21 ?1811  ?WBC 13.4*  ?HGB 12.2  ?HCT 38.6  ?PLT 216  ? ?No results for input(s): APTT, INR in the last 168 hours.  ?   ? ? ?Other tests/results:  ?Lumbar xrays 08/16/21 ?  ?IMPRESSION: ?1. L4-5 and L5-S1 PLIF without acute finding. ?2. Limited assessment due to scoliosis with underpenetration on the ?lateral view. ?  ?  ?Electronically Signed ?  By: Jorje Guild M.D. ?  On: 08/16/2021 07:42 ? ?Assessment/Plan: ? ?Brittany Lara is a 81 y.o presenting with chronic bilateral low back pain and left-sided sciatica s/p L4-5 and L5-S1 TFESI, L4-S1 PSF on 08/15/21. ? ?- mobilize ?- pain control ?- DVT prophylaxis ?- PTOT ? ?Cooper Render PA-C ?Department of Neurosurgery ? ?  ?

## 2021-08-16 NOTE — Discharge Summary (Signed)
Physician Discharge Summary  ?Patient ID: ?Brittany Lara ?MRN: [id number] ?DOB/AGE: 02-04-1941 81 y.o. ? ?Admit date: 08/15/2021 ?Discharge date: 08/16/2021 ? ?Admission Diagnoses: s/p lumbar fusion ? ?Discharge Diagnoses:  ?Principal Problem: ?  S/P spinal fusion ? ? ?Discharged Condition: good ? ?Hospital Course:  ?Brittany Lara is an 81 year old status post L4-5 and L5-S1 TFESI, L4-S1 PSF. She was admitted overnight post-operatively for drain management and pain control.  She was seen by physical therapy and deemed appropriate for discharge home with home health physical therapy.  Her pain was well controlled on postop day 1 and her drain output was low enough for removal.  She was requesting discharge home.  She was given medications for pain, muscle relaxant and a stool softener.  Her postoperative course was complicated by asymptomatic hypotension.  She was instructed on appropriate management of this including home monitoring of her blood pressure and holding her blood pressure medications when her blood pressure is low.  Should she become symptomatic she was instructed to call our office.  She was discharged home on postop day 1. ? ?Consults: None ? ?Significant Diagnostic Studies:  ?Lumbar xray 08/16/21 ?EXAM: ?LUMBAR SPINE - 2-3 VIEW ?  ?COMPARISON:  Fluoroscopy from 1 day prior ?  ?FINDINGS: ?L4-5 and L5-S1 fusion. Limited assessment of hardware due to ?scoliotic curvature and underpenetration on the lateral view. No ?visible fracture or hardware displacement when compared to prior ?fluoroscopy ?  ?IMPRESSION: ?1. L4-5 and L5-S1 PLIF without acute finding. ?2. Limited assessment due to scoliosis with underpenetration on the ?lateral view. ?  ?  ?Electronically Signed ?  By: Jorje Guild M.D. ? ?Treatments: surgery: as above.  See separately dictated operative report for further details ? ?Discharge Exam: ?Blood pressure (!) 96/52, pulse (!) 58, temperature 97.7 ?F (36.5 ?C), resp. rate 18, height '5\' 4"'$   (1.626 m), weight 101.3 kg, SpO2 100 %. ?AA Ox3 ?5/5 in bilateral lower extremities ?Incisions intact with postoperative bandages in place ? ?Disposition: Discharge disposition: 06-Home-Health Care Svc ? ? ? ? ? ? ?Discharge Instructions   ? ? Incentive spirometry RT   Complete by: As directed ?  ? Remove dressing in 24 hours   Complete by: As directed ?  ? ?  ? ?Allergies as of 08/16/2021   ?No Known Allergies ?  ? ?  ?Medication List  ?  ? ?STOP taking these medications   ? ?cetirizine 10 MG tablet ?Commonly known as: ZYRTEC ?  ?diclofenac 75 MG EC tablet ?Commonly known as: VOLTAREN ?  ?traMADol 50 MG tablet ?Commonly known as: ULTRAM ?  ? ?  ? ?TAKE these medications   ? ?aspirin EC 81 MG tablet ?Take 81 mg by mouth daily. ?  ?furosemide 20 MG tablet ?Commonly known as: LASIX ?Take 20 mg by mouth daily. ?  ?gabapentin 300 MG capsule ?Commonly known as: NEURONTIN ?Take 600 mg by mouth at bedtime. ?  ?Klor-Con M10 10 MEQ tablet ?Generic drug: potassium chloride ?Take 10 mEq by mouth daily. ?  ?lisinopril 40 MG tablet ?Commonly known as: ZESTRIL ?Take 40 mg by mouth daily. ?  ?methocarbamol 500 MG tablet ?Commonly known as: ROBAXIN ?Take 1 tablet (500 mg total) by mouth every 8 (eight) hours as needed for muscle spasms. ?  ?oxyCODONE 5 MG immediate release tablet ?Commonly known as: Oxy IR/ROXICODONE ?Take 1 tablet (5 mg total) by mouth every 3 (three) hours as needed for moderate pain ((score 4 to 6)). ?  ?senna 8.6 MG Tabs tablet ?Commonly  known as: SENOKOT ?Take 1 tablet (8.6 mg total) by mouth 2 (two) times daily. ?  ?Vitamin B-12 5000 MCG Tbdp ?Take 5,000 mcg by mouth daily. ?  ?Vitamin D3 125 MCG (5000 UT) Caps ?Take 5,000 Units by mouth every Sunday. ?  ? ?  ? ?  ?  ? ? ?  ?Durable Medical Equipment  ?(From admission, onward)  ?  ? ? ?  ? ?  Start     Ordered  ? 08/16/21 1303  DME Walker  Once       ?Question Answer Comment  ?Walker: With 5 Inch Wheels   ?Patient needs a walker to treat with the following  condition S/P spinal fusion   ?  ? 08/16/21 1304  ? 08/16/21 1217  For home use only DME Walker rolling  Once       ?Question Answer Comment  ?Walker: With 5 Inch Wheels   ?Patient needs a walker to treat with the following condition Weakness   ?  ? 08/16/21 1216  ? ?  ?  ? ?  ? ? Follow-up Information   ? ? Loleta Dicker, PA Follow up in 2 week(s).   ?Why: for post-op and incision check. This appointment was scheduled pre-op and should be included in your pre-op paperwork ?Contact information: ?[place] [address] ?[phone] ? ? ?  ?  ? ?  ?  ? ?  ? ? ?Signed: ?Loleta Dicker ?08/16/2021, 1:04 PM ? ? ?

## 2021-08-16 NOTE — Progress Notes (Addendum)
Met with the patient and her husband in the room, She will need a RW, [employer] will deliver to the room, I reached out to [facility] with [facility] and am awaiting a response to see if they will accept the patient for [facility] ?The patient is up in the room independent  ?Her husband provides transportation ?No additional needs ? ?Update, [facility] unable to accept the patient ? ?I reached out to [facility] at [facility] and asked if they can accept the patient, awaiting a response ? ?[facility] accepted the patient for [facility] ? ?

## 2021-08-17 ENCOUNTER — Encounter: Payer: Self-pay | Admitting: Neurosurgery

## 2021-08-18 DIAGNOSIS — Z9181 History of falling: Secondary | ICD-10-CM | POA: Diagnosis not present

## 2021-08-18 DIAGNOSIS — Z981 Arthrodesis status: Secondary | ICD-10-CM | POA: Diagnosis not present

## 2021-08-18 DIAGNOSIS — G8929 Other chronic pain: Secondary | ICD-10-CM | POA: Diagnosis not present

## 2021-08-18 DIAGNOSIS — Z7982 Long term (current) use of aspirin: Secondary | ICD-10-CM | POA: Diagnosis not present

## 2021-08-18 DIAGNOSIS — M5442 Lumbago with sciatica, left side: Secondary | ICD-10-CM | POA: Diagnosis not present

## 2021-08-18 DIAGNOSIS — Z4789 Encounter for other orthopedic aftercare: Secondary | ICD-10-CM | POA: Diagnosis not present

## 2021-08-25 DIAGNOSIS — M5442 Lumbago with sciatica, left side: Secondary | ICD-10-CM | POA: Diagnosis not present

## 2021-08-25 DIAGNOSIS — Z7982 Long term (current) use of aspirin: Secondary | ICD-10-CM | POA: Diagnosis not present

## 2021-08-25 DIAGNOSIS — Z9181 History of falling: Secondary | ICD-10-CM | POA: Diagnosis not present

## 2021-08-25 DIAGNOSIS — Z4789 Encounter for other orthopedic aftercare: Secondary | ICD-10-CM | POA: Diagnosis not present

## 2021-08-25 DIAGNOSIS — Z981 Arthrodesis status: Secondary | ICD-10-CM | POA: Diagnosis not present

## 2021-08-25 DIAGNOSIS — G8929 Other chronic pain: Secondary | ICD-10-CM | POA: Diagnosis not present

## 2021-09-05 DIAGNOSIS — M5116 Intervertebral disc disorders with radiculopathy, lumbar region: Secondary | ICD-10-CM | POA: Diagnosis not present

## 2021-09-27 DIAGNOSIS — Z981 Arthrodesis status: Secondary | ICD-10-CM | POA: Diagnosis not present

## 2021-09-27 DIAGNOSIS — M5136 Other intervertebral disc degeneration, lumbar region: Secondary | ICD-10-CM | POA: Diagnosis not present

## 2021-10-18 DIAGNOSIS — M5414 Radiculopathy, thoracic region: Secondary | ICD-10-CM | POA: Diagnosis not present

## 2021-10-18 DIAGNOSIS — M519 Unspecified thoracic, thoracolumbar and lumbosacral intervertebral disc disorder: Secondary | ICD-10-CM | POA: Diagnosis not present

## 2021-11-08 DIAGNOSIS — M4126 Other idiopathic scoliosis, lumbar region: Secondary | ICD-10-CM | POA: Diagnosis not present

## 2021-11-08 DIAGNOSIS — Z981 Arthrodesis status: Secondary | ICD-10-CM | POA: Diagnosis not present

## 2021-12-30 ENCOUNTER — Other Ambulatory Visit: Payer: Self-pay | Admitting: Internal Medicine

## 2021-12-30 DIAGNOSIS — Z1231 Encounter for screening mammogram for malignant neoplasm of breast: Secondary | ICD-10-CM

## 2022-01-17 DIAGNOSIS — L821 Other seborrheic keratosis: Secondary | ICD-10-CM | POA: Diagnosis not present

## 2022-01-17 DIAGNOSIS — L570 Actinic keratosis: Secondary | ICD-10-CM | POA: Diagnosis not present

## 2022-02-06 ENCOUNTER — Ambulatory Visit
Admission: RE | Admit: 2022-02-06 | Discharge: 2022-02-06 | Disposition: A | Discharge status: Home / Self Care | Payer: PPO | Source: Ambulatory Visit | Attending: Internal Medicine | Admitting: Internal Medicine

## 2022-02-06 DIAGNOSIS — Z1231 Encounter for screening mammogram for malignant neoplasm of breast: Secondary | ICD-10-CM | POA: Diagnosis not present

## 2022-03-14 DIAGNOSIS — L570 Actinic keratosis: Secondary | ICD-10-CM | POA: Diagnosis not present

## 2022-03-14 DIAGNOSIS — Z85828 Personal history of other malignant neoplasm of skin: Secondary | ICD-10-CM | POA: Diagnosis not present

## 2022-03-14 DIAGNOSIS — Z86018 Personal history of other benign neoplasm: Secondary | ICD-10-CM | POA: Diagnosis not present

## 2022-03-14 DIAGNOSIS — Z872 Personal history of diseases of the skin and subcutaneous tissue: Secondary | ICD-10-CM | POA: Diagnosis not present

## 2022-03-14 DIAGNOSIS — L578 Other skin changes due to chronic exposure to nonionizing radiation: Secondary | ICD-10-CM | POA: Diagnosis not present

## 2022-04-25 DIAGNOSIS — Z85828 Personal history of other malignant neoplasm of skin: Secondary | ICD-10-CM | POA: Diagnosis not present

## 2022-05-26 ENCOUNTER — Other Ambulatory Visit: Payer: Self-pay

## 2022-05-26 DIAGNOSIS — Z981 Arthrodesis status: Secondary | ICD-10-CM

## 2022-05-30 ENCOUNTER — Ambulatory Visit
Admission: RE | Admit: 2022-05-30 | Discharge: 2022-05-30 | Disposition: A | Discharge status: Home / Self Care | Payer: PPO | Source: Ambulatory Visit | Attending: Neurosurgery | Admitting: Neurosurgery

## 2022-05-30 ENCOUNTER — Encounter: Payer: Self-pay | Admitting: Neurosurgery

## 2022-05-30 ENCOUNTER — Ambulatory Visit (INDEPENDENT_AMBULATORY_CARE_PROVIDER_SITE_OTHER): Payer: PPO | Admitting: Neurosurgery

## 2022-05-30 VITALS — BP 126/80 | Ht 64.0 in | Wt 223.0 lb

## 2022-05-30 DIAGNOSIS — M419 Scoliosis, unspecified: Secondary | ICD-10-CM | POA: Diagnosis not present

## 2022-05-30 DIAGNOSIS — Z981 Arthrodesis status: Secondary | ICD-10-CM

## 2022-05-30 DIAGNOSIS — Z9889 Other specified postprocedural states: Secondary | ICD-10-CM | POA: Diagnosis not present

## 2022-05-30 DIAGNOSIS — Z09 Encounter for follow-up examination after completed treatment for conditions other than malignant neoplasm: Secondary | ICD-10-CM

## 2022-05-30 DIAGNOSIS — G8929 Other chronic pain: Secondary | ICD-10-CM | POA: Diagnosis not present

## 2022-05-30 DIAGNOSIS — M5442 Lumbago with sciatica, left side: Secondary | ICD-10-CM

## 2022-05-30 NOTE — Progress Notes (Signed)
   DOS: 08/15/21 (L4-S1 TLIF)  HISTORY OF PRESENT ILLNESS: 05/30/2022 [name] is status post TLIF.  She is doing very well.Marland Kitchen   PHYSICAL EXAMINATION:   Vitals:   05/30/22 1149  BP: 126/80   General: Patient is well developed, well nourished, calm, collected, and in no apparent distress.  NEUROLOGICAL:  General: In no acute distress.  Awake, alert, oriented to person, place, and time. Pupils equal round and reactive to light.   Strength:  Side Iliopsoas Quads Hamstring PF DF EHL  R '5 5 5 5 5 5  '$ L '5 5 5 5 5 5   '$ Incision c/d/i   ROS (Neurologic): Negative except as noted above  IMAGING: No complaints noted  ASSESSMENT/PLAN:  Brittany Lara is doing well after TLIF.  She is doing very well.  She has minimal pain.  I will see her back on an as-needed basis.  I spent a total of 10 minutes in this patient's care today. This time was spent reviewing pertinent records including imaging studies, obtaining and confirming history, performing a directed evaluation, formulating and discussing my recommendations, and documenting the visit within the medical record.    Meade Maw MD, [facility] Department of Neurosurgery

## 2022-07-31 DIAGNOSIS — E538 Deficiency of other specified B group vitamins: Secondary | ICD-10-CM | POA: Diagnosis not present

## 2022-07-31 DIAGNOSIS — E782 Mixed hyperlipidemia: Secondary | ICD-10-CM | POA: Diagnosis not present

## 2022-08-07 DIAGNOSIS — I10 Essential (primary) hypertension: Secondary | ICD-10-CM | POA: Diagnosis not present

## 2022-08-07 DIAGNOSIS — Z0000 Encounter for general adult medical examination without abnormal findings: Secondary | ICD-10-CM | POA: Diagnosis not present

## 2022-08-07 DIAGNOSIS — R739 Hyperglycemia, unspecified: Secondary | ICD-10-CM | POA: Diagnosis not present

## 2022-08-07 DIAGNOSIS — E782 Mixed hyperlipidemia: Secondary | ICD-10-CM | POA: Diagnosis not present

## 2022-08-07 DIAGNOSIS — M5010 Cervical disc disorder with radiculopathy, unspecified cervical region: Secondary | ICD-10-CM | POA: Diagnosis not present

## 2022-09-12 DIAGNOSIS — J40 Bronchitis, not specified as acute or chronic: Secondary | ICD-10-CM | POA: Diagnosis not present

## 2022-10-27 DIAGNOSIS — J209 Acute bronchitis, unspecified: Secondary | ICD-10-CM | POA: Diagnosis not present

## 2022-10-27 DIAGNOSIS — B9689 Other specified bacterial agents as the cause of diseases classified elsewhere: Secondary | ICD-10-CM | POA: Diagnosis not present

## 2022-10-27 DIAGNOSIS — J302 Other seasonal allergic rhinitis: Secondary | ICD-10-CM | POA: Diagnosis not present

## 2022-10-27 DIAGNOSIS — J019 Acute sinusitis, unspecified: Secondary | ICD-10-CM | POA: Diagnosis not present

## 2022-10-31 DIAGNOSIS — L821 Other seborrheic keratosis: Secondary | ICD-10-CM | POA: Diagnosis not present

## 2022-10-31 DIAGNOSIS — L570 Actinic keratosis: Secondary | ICD-10-CM | POA: Diagnosis not present

## 2022-12-12 DIAGNOSIS — Z85828 Personal history of other malignant neoplasm of skin: Secondary | ICD-10-CM | POA: Diagnosis not present

## 2023-01-01 DIAGNOSIS — L820 Inflamed seborrheic keratosis: Secondary | ICD-10-CM | POA: Diagnosis not present

## 2023-01-16 ENCOUNTER — Other Ambulatory Visit
Admission: RE | Admit: 2023-01-16 | Discharge: 2023-01-16 | Disposition: A | Discharge status: Home / Self Care | Payer: HMO | Source: Ambulatory Visit | Attending: Ophthalmology | Admitting: Ophthalmology

## 2023-01-16 ENCOUNTER — Other Ambulatory Visit: Payer: Self-pay | Admitting: Ophthalmology

## 2023-01-16 DIAGNOSIS — H532 Diplopia: Secondary | ICD-10-CM | POA: Diagnosis not present

## 2023-01-16 DIAGNOSIS — Z961 Presence of intraocular lens: Secondary | ICD-10-CM | POA: Diagnosis not present

## 2023-01-16 DIAGNOSIS — D3132 Benign neoplasm of left choroid: Secondary | ICD-10-CM | POA: Diagnosis not present

## 2023-01-16 LAB — CBC WITH DIFFERENTIAL/PLATELET
Abs Immature Granulocytes: 0.02 10*3/uL (ref 0.00–0.07)
Basophils Absolute: 0.1 10*3/uL (ref 0.0–0.1)
Basophils Relative: 1 %
Eosinophils Absolute: 0.2 10*3/uL (ref 0.0–0.5)
Eosinophils Relative: 3 %
HCT: 38.3 % (ref 36.0–46.0)
Hemoglobin: 12.2 g/dL (ref 12.0–15.0)
Immature Granulocytes: 0 %
Lymphocytes Relative: 31 %
Lymphs Abs: 2.2 10*3/uL (ref 0.7–4.0)
MCH: 30.3 pg (ref 26.0–34.0)
MCHC: 31.9 g/dL (ref 30.0–36.0)
MCV: 95 fL (ref 80.0–100.0)
Monocytes Absolute: 0.5 10*3/uL (ref 0.1–1.0)
Monocytes Relative: 8 %
Neutro Abs: 4.1 10*3/uL (ref 1.7–7.7)
Neutrophils Relative %: 57 %
Platelets: 236 10*3/uL (ref 150–400)
RBC: 4.03 MIL/uL (ref 3.87–5.11)
RDW: 12.7 % (ref 11.5–15.5)
WBC: 7.1 10*3/uL (ref 4.0–10.5)
nRBC: 0 % (ref 0.0–0.2)

## 2023-01-16 LAB — C-REACTIVE PROTEIN: CRP: 0.5 mg/dL (ref <1.0)

## 2023-01-16 LAB — SEDIMENTATION RATE: Sed Rate: 21 mm/hr (ref 0–30)

## 2023-01-18 ENCOUNTER — Encounter: Payer: Self-pay | Admitting: Neurosurgery

## 2023-01-24 ENCOUNTER — Ambulatory Visit
Admission: RE | Admit: 2023-01-24 | Discharge: 2023-01-24 | Disposition: A | Discharge status: Home / Self Care | Payer: HMO | Source: Ambulatory Visit | Attending: Ophthalmology | Admitting: Ophthalmology

## 2023-01-24 DIAGNOSIS — H532 Diplopia: Secondary | ICD-10-CM | POA: Diagnosis not present

## 2023-01-24 DIAGNOSIS — J320 Chronic maxillary sinusitis: Secondary | ICD-10-CM | POA: Diagnosis not present

## 2023-01-24 DIAGNOSIS — I6782 Cerebral ischemia: Secondary | ICD-10-CM | POA: Diagnosis not present

## 2023-01-24 MED ORDER — GADOBUTROL 1 MMOL/ML IV SOLN
10.0000 mL | Freq: Once | INTRAVENOUS | Status: AC | PRN
  Administered 2023-01-24: 10 mL via INTRAVENOUS

## 2023-01-30 ENCOUNTER — Other Ambulatory Visit: Payer: Self-pay | Admitting: Internal Medicine

## 2023-01-30 DIAGNOSIS — Z1231 Encounter for screening mammogram for malignant neoplasm of breast: Secondary | ICD-10-CM

## 2023-02-05 DIAGNOSIS — D3132 Benign neoplasm of left choroid: Secondary | ICD-10-CM | POA: Diagnosis not present

## 2023-02-05 DIAGNOSIS — H532 Diplopia: Secondary | ICD-10-CM | POA: Diagnosis not present

## 2023-02-05 DIAGNOSIS — Z961 Presence of intraocular lens: Secondary | ICD-10-CM | POA: Diagnosis not present

## 2023-02-16 DIAGNOSIS — H532 Diplopia: Secondary | ICD-10-CM | POA: Diagnosis not present

## 2023-02-28 ENCOUNTER — Ambulatory Visit
Admission: RE | Admit: 2023-02-28 | Discharge: 2023-02-28 | Disposition: A | Discharge status: Home / Self Care | Payer: HMO | Source: Ambulatory Visit | Attending: Internal Medicine | Admitting: Internal Medicine

## 2023-02-28 DIAGNOSIS — Z1231 Encounter for screening mammogram for malignant neoplasm of breast: Secondary | ICD-10-CM | POA: Diagnosis not present

## 2023-03-05 DIAGNOSIS — H532 Diplopia: Secondary | ICD-10-CM | POA: Diagnosis not present

## 2023-03-05 DIAGNOSIS — H05403 Unspecified enophthalmos, bilateral: Secondary | ICD-10-CM | POA: Diagnosis not present

## 2023-03-05 DIAGNOSIS — I999 Unspecified disorder of circulatory system: Secondary | ICD-10-CM | POA: Diagnosis not present

## 2023-03-05 DIAGNOSIS — R9082 White matter disease, unspecified: Secondary | ICD-10-CM | POA: Diagnosis not present

## 2023-03-13 DIAGNOSIS — Z961 Presence of intraocular lens: Secondary | ICD-10-CM | POA: Diagnosis not present

## 2023-03-13 DIAGNOSIS — H532 Diplopia: Secondary | ICD-10-CM | POA: Diagnosis not present

## 2023-03-13 DIAGNOSIS — D3132 Benign neoplasm of left choroid: Secondary | ICD-10-CM | POA: Diagnosis not present

## 2023-03-14 DIAGNOSIS — C44519 Basal cell carcinoma of skin of other part of trunk: Secondary | ICD-10-CM | POA: Diagnosis not present

## 2023-03-14 DIAGNOSIS — Z85828 Personal history of other malignant neoplasm of skin: Secondary | ICD-10-CM | POA: Diagnosis not present

## 2023-03-14 DIAGNOSIS — L821 Other seborrheic keratosis: Secondary | ICD-10-CM | POA: Diagnosis not present

## 2023-03-14 DIAGNOSIS — Z872 Personal history of diseases of the skin and subcutaneous tissue: Secondary | ICD-10-CM | POA: Diagnosis not present

## 2023-03-14 DIAGNOSIS — L578 Other skin changes due to chronic exposure to nonionizing radiation: Secondary | ICD-10-CM | POA: Diagnosis not present

## 2023-03-14 DIAGNOSIS — Z86018 Personal history of other benign neoplasm: Secondary | ICD-10-CM | POA: Diagnosis not present

## 2023-03-14 DIAGNOSIS — L570 Actinic keratosis: Secondary | ICD-10-CM | POA: Diagnosis not present

## 2023-03-14 DIAGNOSIS — D485 Neoplasm of uncertain behavior of skin: Secondary | ICD-10-CM | POA: Diagnosis not present

## 2023-04-16 DIAGNOSIS — C44519 Basal cell carcinoma of skin of other part of trunk: Secondary | ICD-10-CM | POA: Diagnosis not present

## 2023-04-16 DIAGNOSIS — C4491 Basal cell carcinoma of skin, unspecified: Secondary | ICD-10-CM | POA: Diagnosis not present

## 2023-05-23 DIAGNOSIS — L821 Other seborrheic keratosis: Secondary | ICD-10-CM | POA: Diagnosis not present

## 2023-07-01 IMAGING — MG MM DIGITAL SCREENING BILAT W/ TOMO AND CAD
5 series · 6 of 13 positions shown · non-contrast
Comparison: Previous exam(s).

CLINICAL DATA: Screening.

EXAM:
DIGITAL SCREENING BILATERAL MAMMOGRAM WITH TOMOSYNTHESIS AND CAD
TECHNIQUE: Bilateral screening digital craniocaudal and mediolateral oblique
mammograms were obtained. Bilateral screening digital breast
tomosynthesis was performed. The images were evaluated with
computer-aided detection.

[R CC synth-2D]
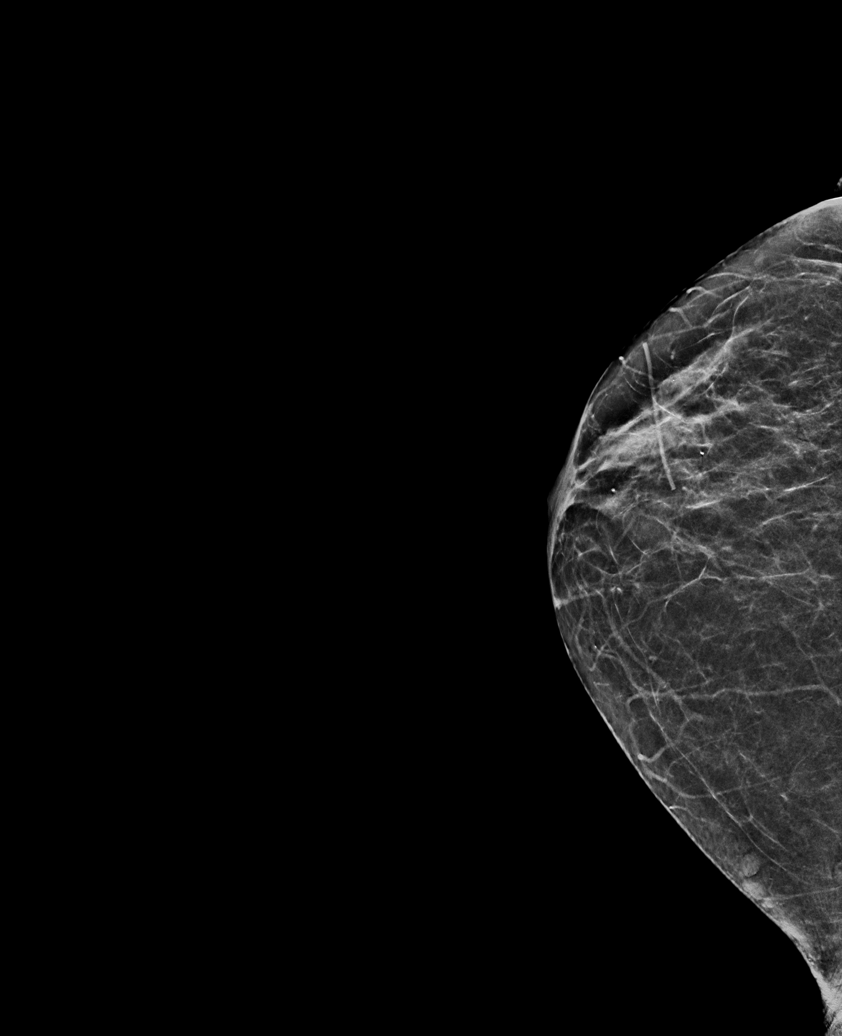

[L CC synth-2D]
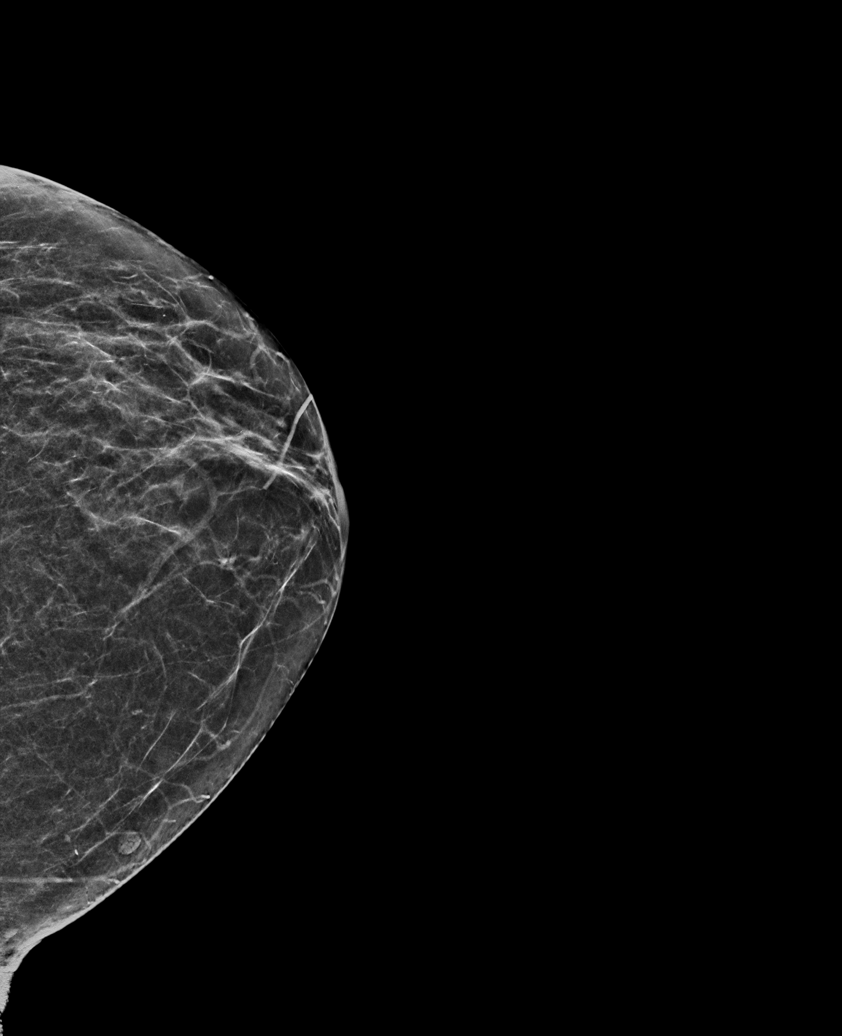

[L MLO synth-2D]
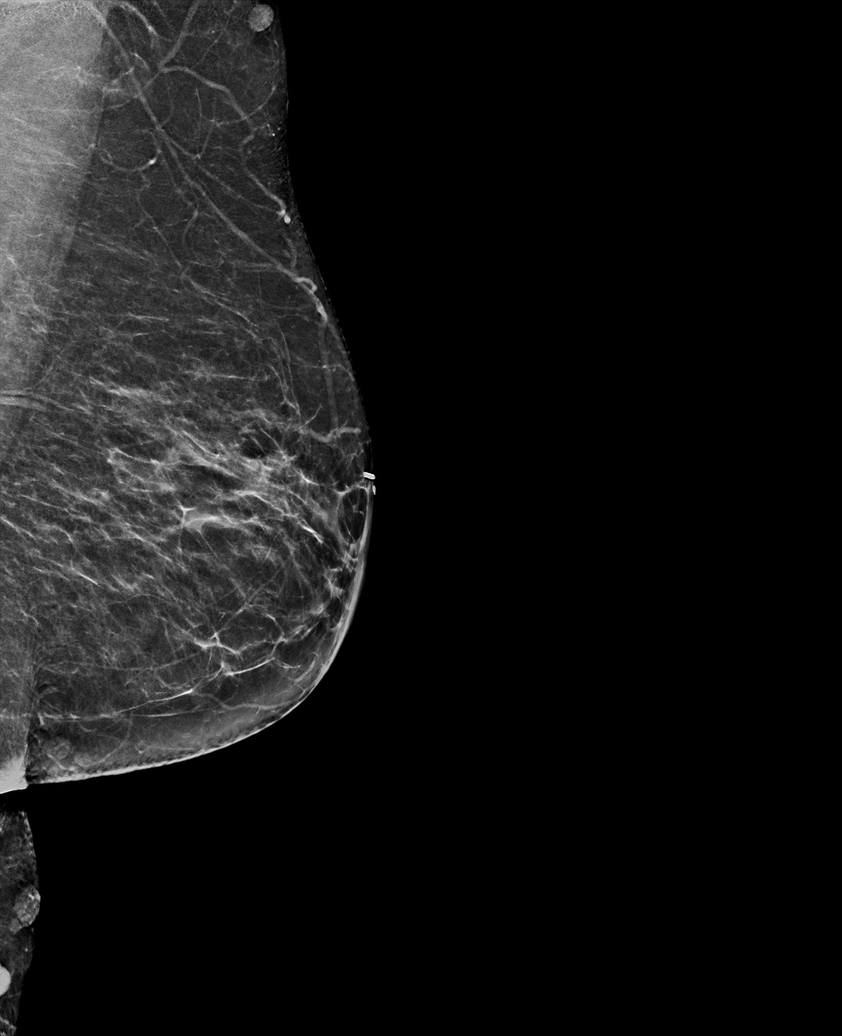

[L MLO tomo · 2 of 63 frames shown]
[frame 21/63]
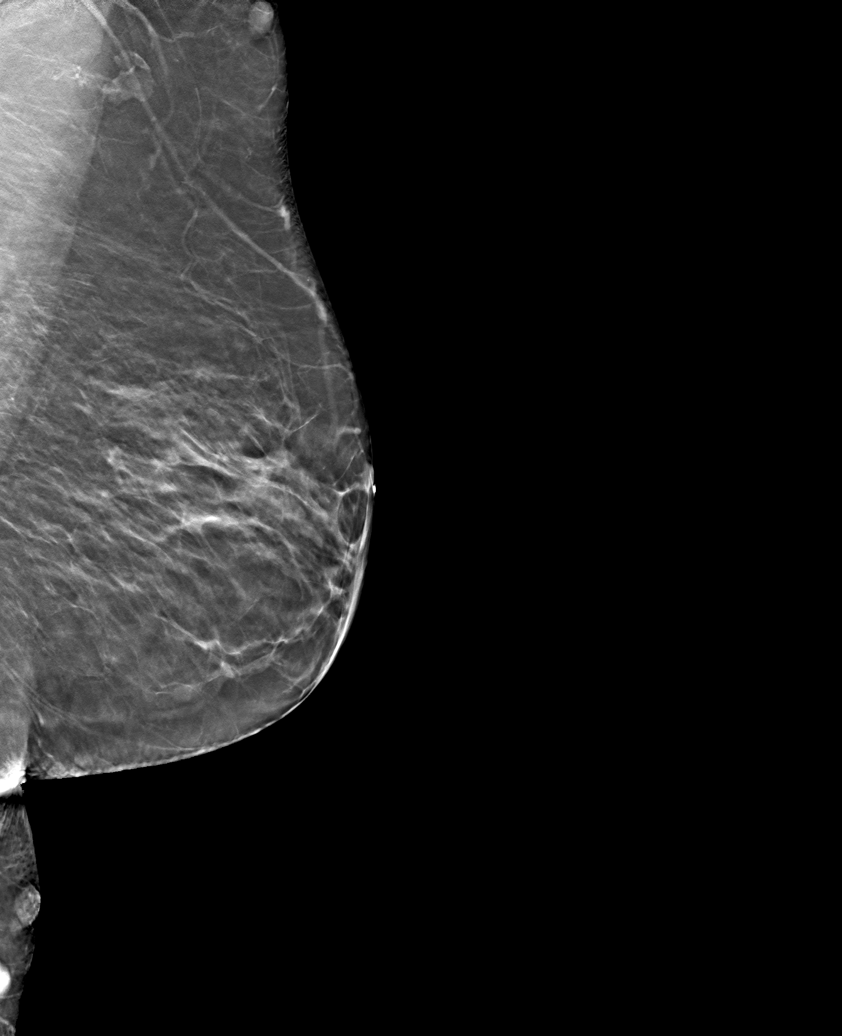
[frame 32/63]
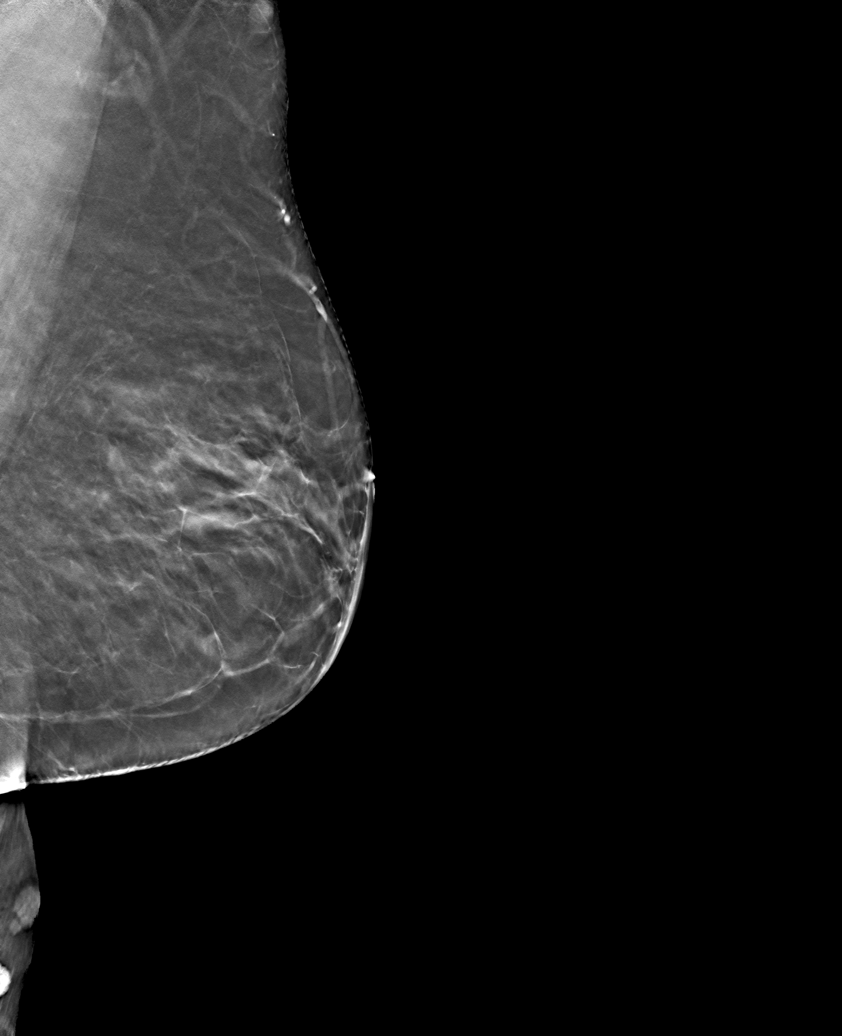

[L CC tomo · tomo slice 28/55.0]
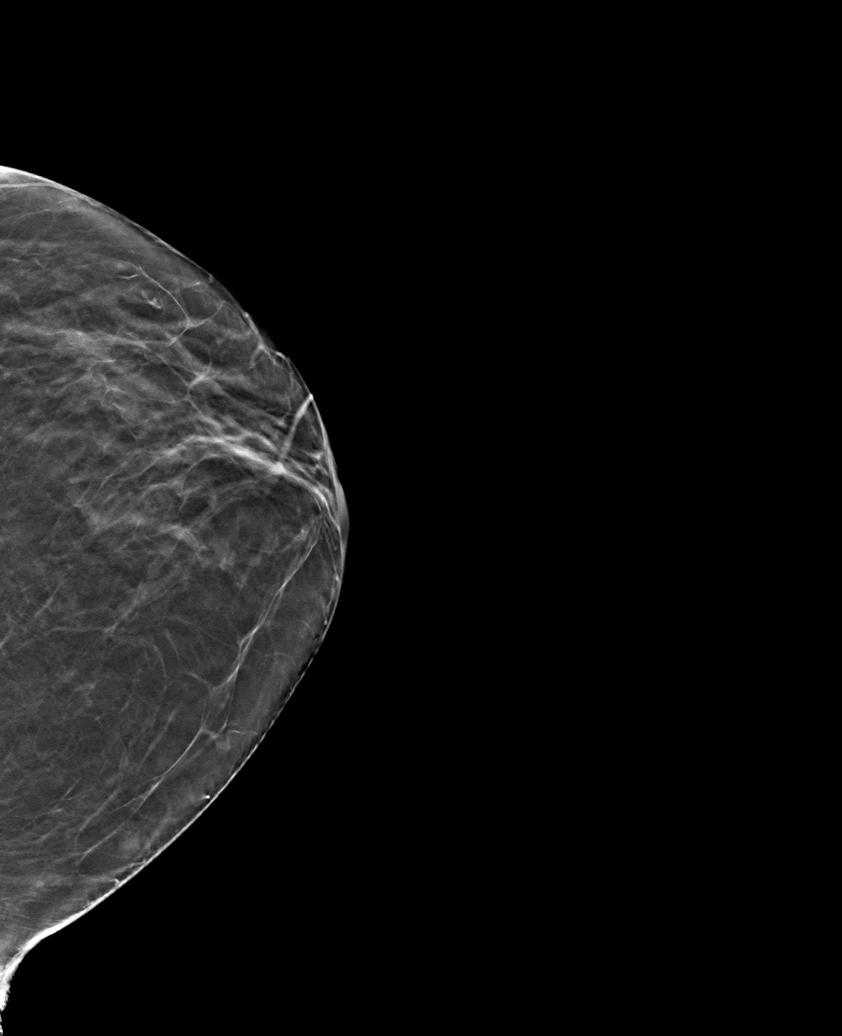

[6 of 13 positions shown; findings below may reference images not displayed]

ACR Breast Density Category b: There are scattered areas of
fibroglandular density.
FINDINGS: There are no findings suspicious for malignancy. Stable postsurgical
changes.
IMPRESSION: No mammographic evidence of malignancy. A result letter of this
screening mammogram will be mailed directly to the patient.

RECOMMENDATION:
Screening mammogram in one year. (Code:R4-R-[id number])

BI-RADS CATEGORY  2: Benign.

## 2023-07-16 DIAGNOSIS — L821 Other seborrheic keratosis: Secondary | ICD-10-CM | POA: Diagnosis not present

## 2023-08-08 DIAGNOSIS — Z79899 Other long term (current) drug therapy: Secondary | ICD-10-CM | POA: Diagnosis not present

## 2023-08-08 DIAGNOSIS — E782 Mixed hyperlipidemia: Secondary | ICD-10-CM | POA: Diagnosis not present

## 2023-08-08 DIAGNOSIS — R739 Hyperglycemia, unspecified: Secondary | ICD-10-CM | POA: Diagnosis not present

## 2023-08-15 DIAGNOSIS — M4146 Neuromuscular scoliosis, lumbar region: Secondary | ICD-10-CM | POA: Diagnosis not present

## 2023-08-15 DIAGNOSIS — R319 Hematuria, unspecified: Secondary | ICD-10-CM | POA: Diagnosis not present

## 2023-08-15 DIAGNOSIS — Z0000 Encounter for general adult medical examination without abnormal findings: Secondary | ICD-10-CM | POA: Diagnosis not present

## 2023-08-15 DIAGNOSIS — E782 Mixed hyperlipidemia: Secondary | ICD-10-CM | POA: Diagnosis not present

## 2023-08-15 DIAGNOSIS — R739 Hyperglycemia, unspecified: Secondary | ICD-10-CM | POA: Diagnosis not present

## 2023-08-15 DIAGNOSIS — I10 Essential (primary) hypertension: Secondary | ICD-10-CM | POA: Diagnosis not present

## 2023-08-15 DIAGNOSIS — M519 Unspecified thoracic, thoracolumbar and lumbosacral intervertebral disc disorder: Secondary | ICD-10-CM | POA: Diagnosis not present

## 2023-09-04 DIAGNOSIS — G7000 Myasthenia gravis without (acute) exacerbation: Secondary | ICD-10-CM | POA: Diagnosis not present

## 2023-09-14 DIAGNOSIS — N029 Recurrent and persistent hematuria with unspecified morphologic changes: Secondary | ICD-10-CM | POA: Diagnosis not present

## 2023-09-14 DIAGNOSIS — R319 Hematuria, unspecified: Secondary | ICD-10-CM | POA: Diagnosis not present

## 2023-09-14 DIAGNOSIS — G7000 Myasthenia gravis without (acute) exacerbation: Secondary | ICD-10-CM | POA: Diagnosis not present

## 2023-12-06 DIAGNOSIS — L02811 Cutaneous abscess of head [any part, except face]: Secondary | ICD-10-CM | POA: Diagnosis not present

## 2023-12-06 DIAGNOSIS — D485 Neoplasm of uncertain behavior of skin: Secondary | ICD-10-CM | POA: Diagnosis not present

## 2023-12-06 DIAGNOSIS — L570 Actinic keratosis: Secondary | ICD-10-CM | POA: Diagnosis not present

## 2023-12-27 DIAGNOSIS — L570 Actinic keratosis: Secondary | ICD-10-CM | POA: Diagnosis not present

## 2023-12-27 DIAGNOSIS — D485 Neoplasm of uncertain behavior of skin: Secondary | ICD-10-CM | POA: Diagnosis not present

## 2024-01-28 DIAGNOSIS — L570 Actinic keratosis: Secondary | ICD-10-CM | POA: Diagnosis not present

## 2024-01-28 DIAGNOSIS — L821 Other seborrheic keratosis: Secondary | ICD-10-CM | POA: Diagnosis not present

## 2024-02-01 ENCOUNTER — Other Ambulatory Visit: Payer: Self-pay | Admitting: Internal Medicine

## 2024-02-01 DIAGNOSIS — Z1231 Encounter for screening mammogram for malignant neoplasm of breast: Secondary | ICD-10-CM

## 2024-03-05 ENCOUNTER — Ambulatory Visit

## 2024-03-12 DIAGNOSIS — Z961 Presence of intraocular lens: Secondary | ICD-10-CM | POA: Diagnosis not present

## 2024-03-12 DIAGNOSIS — M4146 Neuromuscular scoliosis, lumbar region: Secondary | ICD-10-CM | POA: Diagnosis not present

## 2024-03-12 DIAGNOSIS — M5116 Intervertebral disc disorders with radiculopathy, lumbar region: Secondary | ICD-10-CM | POA: Diagnosis not present

## 2024-03-12 DIAGNOSIS — M7918 Myalgia, other site: Secondary | ICD-10-CM | POA: Diagnosis not present

## 2024-03-17 ENCOUNTER — Ambulatory Visit
Admission: RE | Admit: 2024-03-17 | Discharge: 2024-03-17 | Disposition: A | Discharge status: Home / Self Care | Source: Ambulatory Visit | Attending: Internal Medicine | Admitting: Internal Medicine

## 2024-03-17 DIAGNOSIS — Z1231 Encounter for screening mammogram for malignant neoplasm of breast: Secondary | ICD-10-CM | POA: Diagnosis not present

## 2024-04-08 DIAGNOSIS — D485 Neoplasm of uncertain behavior of skin: Secondary | ICD-10-CM | POA: Diagnosis not present

## 2024-04-08 DIAGNOSIS — B1089 Other human herpesvirus infection: Secondary | ICD-10-CM | POA: Diagnosis not present

## 2024-05-16 NOTE — Progress Notes (Signed)
 Brittany Lara                                          MRN: [id number]   05/16/2024   The [facility] Quality Team Specialist reviewed this patient medical record for the purposes of chart review for care gap closure. The following were reviewed: chart review for care gap closure-controlling blood pressure.    [facility] Quality Team
